# Patient Record
Sex: Male | Born: 1970 | Race: Black or African American | Hispanic: No | Smoking: Current every day smoker
Health system: Southern US, Community
[De-identification: ages and names within clinical notes are randomized; demographics above are authoritative.]

## PROBLEM LIST (undated history)

## (undated) DIAGNOSIS — F209 Schizophrenia, unspecified: Secondary | ICD-10-CM

## (undated) DIAGNOSIS — F99 Mental disorder, not otherwise specified: Secondary | ICD-10-CM

## (undated) DIAGNOSIS — I10 Essential (primary) hypertension: Secondary | ICD-10-CM

## (undated) HISTORY — PX: OTHER SURGICAL HISTORY: SHX169

---

## 2003-03-22 ENCOUNTER — Inpatient Hospital Stay (HOSPITAL_COMMUNITY): Admission: AC | Admit: 2003-03-22 | Discharge: 2003-03-24 | Payer: Self-pay

## 2006-02-11 ENCOUNTER — Emergency Department (HOSPITAL_COMMUNITY): Admission: EM | Admit: 2006-02-11 | Discharge: 2006-02-12 | Payer: Self-pay | Admitting: Emergency Medicine

## 2011-05-20 DIAGNOSIS — F200 Paranoid schizophrenia: Secondary | ICD-10-CM | POA: Diagnosis not present

## 2011-08-11 DIAGNOSIS — F200 Paranoid schizophrenia: Secondary | ICD-10-CM | POA: Diagnosis not present

## 2011-11-24 ENCOUNTER — Encounter (HOSPITAL_COMMUNITY): Payer: Self-pay | Admitting: Emergency Medicine

## 2011-11-24 ENCOUNTER — Emergency Department (HOSPITAL_COMMUNITY)
Admission: EM | Admit: 2011-11-24 | Discharge: 2011-11-25 | Disposition: A | Discharge status: Home / Self Care | Payer: Medicare Other | Attending: Emergency Medicine | Admitting: Emergency Medicine

## 2011-11-24 DIAGNOSIS — F1910 Other psychoactive substance abuse, uncomplicated: Secondary | ICD-10-CM

## 2011-11-24 DIAGNOSIS — R030 Elevated blood-pressure reading, without diagnosis of hypertension: Secondary | ICD-10-CM | POA: Insufficient documentation

## 2011-11-24 DIAGNOSIS — R443 Hallucinations, unspecified: Secondary | ICD-10-CM | POA: Insufficient documentation

## 2011-11-24 HISTORY — DX: Mental disorder, not otherwise specified: F99

## 2011-11-24 LAB — COMPREHENSIVE METABOLIC PANEL
ALT: 44 U/L (ref 0–53)
AST: 29 U/L (ref 0–37)
Albumin: 4.2 g/dL (ref 3.5–5.2)
Alkaline Phosphatase: 52 U/L (ref 39–117)
BUN: 8 mg/dL (ref 6–23)
CO2: 19 mEq/L (ref 19–32)
Calcium: 9.2 mg/dL (ref 8.4–10.5)
Chloride: 103 mEq/L (ref 96–112)
Creatinine, Ser: 1.09 mg/dL (ref 0.50–1.35)
GFR calc Af Amer: >90 mL/min (ref >90)
GFR calc non Af Amer: 83 mL/min — ABNORMAL LOW (ref >90)
Glucose, Bld: 93 mg/dL (ref 70–99)
Potassium: 4.2 mEq/L (ref 3.5–5.1)
Sodium: 137 mEq/L (ref 135–145)
Total Bilirubin: 0.3 mg/dL (ref 0.3–1.2)
Total Protein: 7.5 g/dL (ref 6.0–8.3)

## 2011-11-24 LAB — RAPID URINE DRUG SCREEN, HOSP PERFORMED
Amphetamines: NOT DETECTED
Barbiturates: NOT DETECTED
Benzodiazepines: NOT DETECTED
Cocaine: POSITIVE — AB
Opiates: NOT DETECTED
Tetrahydrocannabinol: NOT DETECTED

## 2011-11-24 LAB — CBC
HCT: 46.1 % (ref 39.0–52.0)
Hemoglobin: 16.2 g/dL (ref 13.0–17.0)
MCH: 27.2 pg (ref 26.0–34.0)
MCHC: 35.1 g/dL (ref 30.0–36.0)
MCV: 77.3 fL — ABNORMAL LOW (ref 78.0–100.0)
Platelets: 260 10*3/uL (ref 150–400)
RBC: 5.96 MIL/uL — ABNORMAL HIGH (ref 4.22–5.81)
RDW: 14.2 % (ref 11.5–15.5)
WBC: 7.2 10*3/uL (ref 4.0–10.5)

## 2011-11-24 LAB — ETHANOL: Alcohol, Ethyl (B): 31 mg/dL — ABNORMAL HIGH (ref 0–11)

## 2011-11-24 MED ORDER — ALUM & MAG HYDROXIDE-SIMETH 200-200-20 MG/5ML PO SUSP: 30.0000 mL | ORAL | Status: DC | PRN

## 2011-11-24 MED ORDER — LORAZEPAM 1 MG PO TABS
1.0000 mg | ORAL_TABLET | Freq: Three times a day (TID) | ORAL | Status: DC | PRN
  Administered 2011-11-24: 1 mg via ORAL
  Filled 2011-11-24: qty 1

## 2011-11-24 MED ORDER — ZOLPIDEM TARTRATE 5 MG PO TABS
5.0000 mg | ORAL_TABLET | Freq: Every evening | ORAL | Status: DC | PRN
  Administered 2011-11-24: 5 mg via ORAL
  Filled 2011-11-24: qty 1

## 2011-11-24 MED ORDER — ACETAMINOPHEN 325 MG PO TABS: 650.0000 mg | ORAL_TABLET | ORAL | Status: DC | PRN

## 2011-11-24 MED ORDER — IBUPROFEN 600 MG PO TABS
600.0000 mg | ORAL_TABLET | Freq: Three times a day (TID) | ORAL | Status: DC | PRN
  Administered 2011-11-25: 600 mg via ORAL
  Filled 2011-11-24: qty 3

## 2011-11-24 MED ORDER — NICOTINE 21 MG/24HR TD PT24
21.0000 mg | MEDICATED_PATCH | Freq: Every day | TRANSDERMAL | Status: DC
  Administered 2011-11-24 – 2011-11-25 (×2): 21 mg via TRANSDERMAL
  Filled 2011-11-24 (×2): qty 1

## 2011-11-24 MED ORDER — HYDROCHLOROTHIAZIDE 25 MG PO TABS
25.0000 mg | ORAL_TABLET | Freq: Every day | ORAL | Status: DC
  Administered 2011-11-24 – 2011-11-25 (×2): 25 mg via ORAL
  Filled 2011-11-24 (×3): qty 1

## 2011-11-24 MED ORDER — ONDANSETRON HCL 4 MG PO TABS: 4.0000 mg | ORAL_TABLET | Freq: Three times a day (TID) | ORAL | Status: DC | PRN

## 2011-11-24 NOTE — ED Provider Notes (Addendum)
History     CSN: [id number]  Arrival date & time 11/24/11  1538   First MD Initiated Contact with Patient 11/24/11 1608      Chief Complaint  Patient presents with  . Medical Clearance    (Consider location/radiation/quality/duration/timing/severity/associated sxs/prior treatment) HPI Patient is a 41 year old male who has history of polysubstance abuse as well as schizophrenia. Patient reported to nursing staff that he has been hearing voices that were telling him to commit suicide. During our discussion patient actively denied suicidal or homicidal ideation. He did endorse hallucinations telling him to use drugs. He uses cocaine as well as beer and reports that his last use was yesterday. Patient is only followed up at [facility]. He was last hospitalized at [facility] for the same. Patient is on monthly injection of Haldol and denies missing this. There are no other associated or modifying factors. History reviewed. No pertinent past medical history.  History reviewed. No pertinent past surgical history.  History reviewed. No pertinent family history.  History  Substance Use Topics  . Smoking status: Not on file  . Smokeless tobacco: Not on file  . Alcohol Use: Not on file      Review of Systems  Constitutional: Negative.   HENT: Negative.   Eyes: Negative.   Respiratory: Negative.   Cardiovascular: Negative.   Gastrointestinal: Negative.   Genitourinary: Negative.   Musculoskeletal: Negative.   Neurological: Negative.   Hematological: Negative.   Psychiatric/Behavioral: Positive for hallucinations. Negative for suicidal ideas.       Substance abuse including cocaine and alcohol  All other systems reviewed and are negative.    Allergies  Review of patient's allergies indicates no known allergies.  Home Medications   Current Outpatient Rx  Name Route Sig Dispense Refill  . ASPIRIN-ACETAMINOPHEN-CAFFEINE 500-325-65 MG PO PACK Oral Take 1 packet by mouth  every 6 (six) hours as needed.    Marland Kitchen HALOPERIDOL DECANOATE 50 MG/ML IM SOLN Intramuscular Inject into the muscle every 28 (twenty-eight) days.       BP 157/102  Pulse 68  Temp 98.7 F (37.1 C)  SpO2 98%  Physical Exam  Nursing note and vitals reviewed. GEN: Well-developed, well-nourished male in no distress HEENT: Atraumatic, normocephalic. Oropharynx clear without erythema EYES: PERRLA BL, no scleral icterus. NECK: Trachea midline, no meningismus CV: regular rate and rhythm. No murmurs, rubs, or gallops PULM: No respiratory distress.  No crackles, wheezes, or rales. GI: soft, non-tender. No guarding, rebound, or tenderness. + bowel sounds  GU: deferred Neuro: cranial nerves grossly 2-12 intact, no abnormalities of strength or sensation, A and O x 3 MSK: Patient moves all 4 extremities symmetrically, no deformity, edema, or injury noted Skin: No rashes petechiae, purpura, or jaundice Psych: Denies suicidal or homicidal ideation though he endorsed suicidal ideation to nursing staff. Patient endorses auditory hallucinations telling him to use drugs. He endorses cocaine and alcohol use.  ED Course  Procedures (including critical care time)  Labs Reviewed  CBC - Abnormal; Notable for the following:    RBC 5.96 (*)     MCV 77.3 (*)     All other components within normal limits  COMPREHENSIVE METABOLIC PANEL - Abnormal; Notable for the following:    GFR calc non Af Amer 83 (*)     All other components within normal limits  ETHANOL - Abnormal; Notable for the following:    Alcohol, Ethyl (B) 31 (*)     All other components within normal limits  URINE RAPID  DRUG SCREEN (HOSP PERFORMED)   No results found.   1. Hallucinations   2. Polysubstance abuse       MDM  She was evaluated by myself. Based on evaluation patient had workup for likely admission for psychosis and substance abuse. Patient had no other complaints and was not actively suicidal or homicidal on my assessment.  Labs were unremarkable. Initial blood pressure has been recorded as 187/126. On repeat this was 157/102. Patient does not take any regular hypertension meds. Holding orders were placed. Patient had normal renal function and no allergies. He was started on HCTZ for his blood pressure. Act was consulted for placement.          Cyndra Numbers, MD 11/24/11 1610  Cyndra Numbers, MD 11/25/11 0010

## 2011-11-24 NOTE — ED Notes (Signed)
Pt is a pt at [facility]. Pt states the voices in his head are telling him to do drugs. Pt states sometimes the voices are telling him to commit suicide. Pt states these symptoms have been going on for a couple of months and today he "got fed up with it" and decidid he needed help. Pt BIB his mother. Pt calm, cooperative.

## 2011-11-24 NOTE — ED Notes (Signed)
Pt's mother took one pt belongings bag home.

## 2011-11-24 NOTE — ED Notes (Signed)
Pt. Reports he is here to stop taking drugs.  He denies S.I. Or H.I.  Pt. Reports he has history of schizophrenia.  Reports voices tell him to take cocaine and sometimes to kill himself.

## 2011-11-25 ENCOUNTER — Encounter (HOSPITAL_COMMUNITY): Payer: Self-pay | Admitting: *Deleted

## 2011-11-25 DIAGNOSIS — R443 Hallucinations, unspecified: Secondary | ICD-10-CM

## 2011-11-25 DIAGNOSIS — F1910 Other psychoactive substance abuse, uncomplicated: Secondary | ICD-10-CM

## 2011-11-25 NOTE — ED Notes (Signed)
Call out to ED Doctor due to elevated B/P noted at 154/110. Advised to Continue to document and monitor B/P.  Pt with no signs or symptoms of distress or discomfort at this time. Pt due for HCTZ this am. Will follow up with dayshift nurse regarding elevated B/P. B/P at this time Lower than admitting B/P, see Doc vitals.

## 2011-11-25 NOTE — ED Notes (Signed)
Pt. Denies S/H/I.  Pt. Reports voices telling him to take cocaine. Last used cocaine x2 days ago.  Pt. Pleasant, cooperative.

## 2011-11-25 NOTE — ED Notes (Signed)
Pt having breakfast.  Pt denies hearing voices, denies SI and HI.  Sitter at bedside.

## 2011-11-25 NOTE — ED Notes (Signed)
Report given to [facility] and pt transferred to Rm 36.  Pt had no belongings to transfer

## 2011-11-25 NOTE — BH Assessment (Signed)
Assessment Note   Joseph Gallegos is an 41 y.o. male. Pt presents to [facility] per his report his Blood Pressure was "high" and his mother wanted him to get help with his Cocaine/Crack use. Pt reports yesterday that he heard voices while using cocaine,pt reports that the voices shouted"Cocaine Wes". Pt reports that he only hears voices while using cocaine and denies hx of hearing voices when he is not using cocaine. Pt denies SI,HI, and denies any current psychotic symptoms and reports that he has not heard any voices today. Pt reports that he is fine at this point and is not interested in Substance Abuse Treament at this time. Outpatient SA referrals provided and pt recommended to utilize referrals as needed. Pt also encouraged to continue psychiatric follow up with [facility]. Dr.Jonnalagadda consulted with pt and recommended that pt be discharged to follow up with current provider.   Axis I: Cocaine Abuse Axis II: Deferred Axis III:  Past Medical History  Diagnosis Date  . Mental disorder    Axis IV: other psychosocial or environmental problems, problems related to social environment and problems with primary support group Axis V: 51-60 moderate symptoms  Past Medical History:  Past Medical History  Diagnosis Date  . Mental disorder     History reviewed. No pertinent past surgical history.  Family History: History reviewed. No pertinent family history.  Social History:  does not have a smoking history on file. He does not have any smokeless tobacco history on file. He reports that he uses illicit drugs (Cocaine). His alcohol history not on file.  Additional Social History:  Alcohol / Drug Use Prescriptions:  (yes) Over the Counter:  (none reported) History of alcohol / drug use?: Yes Substance #1 Name of Substance 1:  (Crack/Cocaine) 1 - Age of First Use:  (24 or 25) 1 - Amount (size/oz):  (1-2 crack rocks) 1 - Frequency:  (Daily) 1 - Duration:  (ongoing use for  yrs) 1 - Last Use  / Amount:  (unknown)  CIWA: CIWA-Ar BP: 146/104 mmHg Pulse Rate: 65  COWS:    Allergies: No Known Allergies  Home Medications:  (Not in a hospital admission)  OB/GYN Status:  No LMP for male patient.  General Assessment Data Location of Assessment: [facility] ED ACT Assessment: Yes Living Arrangements: Alone Can pt return to current living arrangement?: Yes Admission Status: Voluntary Is patient capable of signing voluntary admission?: Yes Transfer from: Home Referral Source: Self/Family/Friend  Education Status Is patient currently in school?: No  Risk to self Suicidal Ideation: No Suicidal Intent: No Is patient at risk for suicide?: No Suicidal Plan?: No Access to Means: No What has been your use of drugs/alcohol within the last 12 months?: crack/cocaine Previous Attempts/Gestures: No Other Self Harm Risks: none Triggers for Past Attempts: None known Intentional Self Injurious Behavior: None Family Suicide History: No Recent stressful life event(s): Conflict (Comment) (conflict w/mom who wants him to get help) Persecutory voices/beliefs?: No Depression: No Substance abuse history and/or treatment for substance abuse?: Yes Suicide prevention information given to non-admitted patients: Yes  Risk to Others Homicidal Ideation: No Thoughts of Harm to Others: No Current Homicidal Intent: No Current Homicidal Plan: No Access to Homicidal Means: No Identified Victim: na History of harm to others?: No Assessment of Violence: None Noted Violent Behavior Description: Cooperative Does patient have access to weapons?: No Criminal Charges Pending?: No Does patient have a court date: No  Psychosis Hallucinations: Auditory ( yesterday voices "shouted  cocaine Wes") Delusions: None  noted  Mental Status Report Appear/Hygiene: Other (Comment) (Appropriate) Eye Contact: Fair Motor Activity: Other (Comment) Speech: Logical/coherent Level of Consciousness: Alert Mood: Other  (Comment) (Appropriate) Affect: Appropriate to circumstance Anxiety Level: Minimal Thought Processes: Coherent;Relevant Judgement: Unimpaired Orientation: Person;Place;Time;Situation Obsessive Compulsive Thoughts/Behaviors: None  Cognitive Functioning Concentration: Normal Memory: Recent Intact;Remote Intact IQ: Average Insight: Fair Impulse Control: Poor Appetite: Good Sleep: No Change Vegetative Symptoms: None  ADLScreening [facility] Assessment Services) Patient's cognitive ability adequate to safely complete daily activities?: Yes Patient able to express need for assistance with ADLs?: Yes Independently performs ADLs?: Yes  Abuse/Neglect [facility]) Physical Abuse: Denies Verbal Abuse: Denies Sexual Abuse: Denies  Prior Inpatient Therapy Prior Inpatient Therapy: No  Prior Outpatient Therapy Prior Outpatient Therapy: Yes Prior Therapy Dates: Current  Prior Therapy Facilty/Provider(s): [facility] Reason for Treatment: Meds  ADL Screening (condition at time of admission) Patient's cognitive ability adequate to safely complete daily activities?: Yes Patient able to express need for assistance with ADLs?: Yes Independently performs ADLs?: Yes Weakness of Legs: None Weakness of Arms/Hands: None  Home Assistive Devices/Equipment Home Assistive Devices/Equipment: None    Abuse/Neglect Assessment (Assessment to be complete while patient is alone) Physical Abuse: Denies Verbal Abuse: Denies Sexual Abuse: Denies Exploitation of patient/patient's resources: Denies Self-Neglect: Denies Values / Beliefs Cultural Requests During Hospitalization: None Spiritual Requests During Hospitalization: None   Advance Directives (For Healthcare) Advance Directive: Patient does not have advance directive;Patient would not like information Nutrition Screen Unintentional weight loss greater than 10lbs within the last month: No Problems chewing or swallowing foods and/or liquids: No Home Tube  Feeding or Total Parenteral Nutrition (TPN): No Patient appears severely malnourished: No  Additional Information 1:1 In Past 12 Months?: No CIRT Risk: No Elopement Risk: No Does patient have medical clearance?: No     Disposition:  Disposition Disposition of Patient: Outpatient treatment (Pt referred to F/U with current provider,SA referrals provid) Type of outpatient treatment: Adult  On Site Evaluation by:   Reviewed with Physician:     Bjorn Pippin 11/25/2011 5:52 PM

## 2011-11-25 NOTE — Consult Note (Signed)
Reason for Consult: Cocaine-induced psychosis Referring Physician: Dr. Blima Singer Joseph Gallegos is an 41 y.o. male.  HPI: Patient was seen and chart reviewed. Patient has a chronic history of for schizophrenia and has been receiving outpatient psychiatric services from a [facility]. Patient has been placed on depot medication of Haldol, last taken shot was November 02, 2011. Patient was brought in by his mother because he is reporting having the hearing voices telling him to do more drugs. Patient has been feeling better today and stated he has no hallucinations. He does not hear voices any more. He. He has denied symptoms of depression, anxiety, suicidal ideation, homicidal ideation, intentions and plans.  History reviewed. No pertinent past medical history.  History reviewed. No pertinent past surgical history.  History reviewed. No pertinent family history.  Social History:  does not have a smoking history on file. He does not have any smokeless tobacco history on file. His alcohol and drug histories not on file.  Allergies: No Known Allergies  Medications: I have reviewed the patient's current medications.  Results for orders placed during the hospital encounter of 11/24/11 (from the past 48 hour(s))  CBC     Status: Abnormal   Collection Time   11/24/11  4:10 PM      Component Value Range Comment   WBC 7.2  4.0 - 10.5 K/uL    RBC 5.96 (*) 4.22 - 5.81 MIL/uL    Hemoglobin 16.2  13.0 - 17.0 g/dL    HCT 21.3  08.6 - 57.8 %    MCV 77.3 (*) 78.0 - 100.0 fL    MCH 27.2  26.0 - 34.0 pg    MCHC 35.1  30.0 - 36.0 g/dL    RDW 46.9  62.9 - 52.8 %    Platelets 260  150 - 400 K/uL   COMPREHENSIVE METABOLIC PANEL     Status: Abnormal   Collection Time   11/24/11  4:10 PM      Component Value Range Comment   Sodium 137  135 - 145 mEq/L    Potassium 4.2  3.5 - 5.1 mEq/L    Chloride 103  96 - 112 mEq/L    CO2 19  19 - 32 mEq/L    Glucose, Bld 93  70 - 99 mg/dL    BUN 8  6  - 23 mg/dL    Creatinine, Ser 4.13  0.50 - 1.35 mg/dL    Calcium 9.2  8.4 - 24.4 mg/dL    Total Protein 7.5  6.0 - 8.3 g/dL    Albumin 4.2  3.5 - 5.2 g/dL    AST 29  0 - 37 U/L    ALT 44  0 - 53 U/L    Alkaline Phosphatase 52  39 - 117 U/L    Total Bilirubin 0.3  0.3 - 1.2 mg/dL    GFR calc non Af Amer 83 (*) >90 mL/min    GFR calc Af Amer >90  >90 mL/min   ETHANOL     Status: Abnormal   Collection Time   11/24/11  4:10 PM      Component Value Range Comment   Alcohol, Ethyl (B) 31 (*) 0 - 11 mg/dL   URINE RAPID DRUG SCREEN (HOSP PERFORMED)     Status: Abnormal   Collection Time   11/24/11  4:24 PM      Component Value Range Comment   Opiates NONE DETECTED  NONE DETECTED    Cocaine POSITIVE (*)  NONE DETECTED    Benzodiazepines NONE DETECTED  NONE DETECTED    Amphetamines NONE DETECTED  NONE DETECTED    Tetrahydrocannabinol NONE DETECTED  NONE DETECTED    Barbiturates NONE DETECTED  NONE DETECTED     No results found.  Positive for illegal drug usage Blood pressure 154/110, pulse 73, temperature 97.9 F (36.6 C), temperature source Oral, resp. rate 18, SpO2 98.00%.   Assessment/Plan: Schizophrenia, undifferentiated Cocaine abuse  Substance induced psychosis  Recommended discharge patient to the home with a plan of outpatient psychiatric services. Patient does not meet criteria for acute psychiatric hospitalization.  Ravindra Baranek,JANARDHAHA R. 11/25/2011, 12:09 PM

## 2011-11-25 NOTE — ED Notes (Signed)
Pt. Awaiting ACT team information before D/C

## 2012-11-09 DIAGNOSIS — F200 Paranoid schizophrenia: Secondary | ICD-10-CM | POA: Diagnosis not present

## 2012-12-07 DIAGNOSIS — F200 Paranoid schizophrenia: Secondary | ICD-10-CM | POA: Diagnosis not present

## 2013-01-04 DIAGNOSIS — F200 Paranoid schizophrenia: Secondary | ICD-10-CM | POA: Diagnosis not present

## 2013-01-22 DIAGNOSIS — F200 Paranoid schizophrenia: Secondary | ICD-10-CM | POA: Diagnosis not present

## 2013-02-01 DIAGNOSIS — F200 Paranoid schizophrenia: Secondary | ICD-10-CM | POA: Diagnosis not present

## 2013-03-01 DIAGNOSIS — F200 Paranoid schizophrenia: Secondary | ICD-10-CM | POA: Diagnosis not present

## 2013-04-05 DIAGNOSIS — F200 Paranoid schizophrenia: Secondary | ICD-10-CM | POA: Diagnosis not present

## 2013-04-09 DIAGNOSIS — F209 Schizophrenia, unspecified: Secondary | ICD-10-CM | POA: Diagnosis not present

## 2013-05-03 DIAGNOSIS — F200 Paranoid schizophrenia: Secondary | ICD-10-CM | POA: Diagnosis not present

## 2013-06-07 DIAGNOSIS — F209 Schizophrenia, unspecified: Secondary | ICD-10-CM | POA: Diagnosis not present

## 2013-06-07 DIAGNOSIS — F200 Paranoid schizophrenia: Secondary | ICD-10-CM | POA: Diagnosis not present

## 2013-07-05 DIAGNOSIS — F209 Schizophrenia, unspecified: Secondary | ICD-10-CM | POA: Diagnosis not present

## 2013-08-02 DIAGNOSIS — F209 Schizophrenia, unspecified: Secondary | ICD-10-CM | POA: Diagnosis not present

## 2013-08-30 DIAGNOSIS — F209 Schizophrenia, unspecified: Secondary | ICD-10-CM | POA: Diagnosis not present

## 2013-09-27 DIAGNOSIS — F209 Schizophrenia, unspecified: Secondary | ICD-10-CM | POA: Diagnosis not present

## 2013-10-25 DIAGNOSIS — F209 Schizophrenia, unspecified: Secondary | ICD-10-CM | POA: Diagnosis not present

## 2013-10-28 DIAGNOSIS — F209 Schizophrenia, unspecified: Secondary | ICD-10-CM | POA: Diagnosis not present

## 2013-11-22 DIAGNOSIS — F209 Schizophrenia, unspecified: Secondary | ICD-10-CM | POA: Diagnosis not present

## 2013-12-20 DIAGNOSIS — F209 Schizophrenia, unspecified: Secondary | ICD-10-CM | POA: Diagnosis not present

## 2014-01-17 DIAGNOSIS — F209 Schizophrenia, unspecified: Secondary | ICD-10-CM | POA: Diagnosis not present

## 2014-02-14 DIAGNOSIS — F209 Schizophrenia, unspecified: Secondary | ICD-10-CM | POA: Diagnosis not present

## 2014-05-09 DIAGNOSIS — F209 Schizophrenia, unspecified: Secondary | ICD-10-CM | POA: Diagnosis not present

## 2014-05-29 DIAGNOSIS — F209 Schizophrenia, unspecified: Secondary | ICD-10-CM | POA: Diagnosis not present

## 2014-05-29 DIAGNOSIS — I10 Essential (primary) hypertension: Secondary | ICD-10-CM | POA: Diagnosis not present

## 2014-05-29 DIAGNOSIS — F17290 Nicotine dependence, other tobacco product, uncomplicated: Secondary | ICD-10-CM | POA: Diagnosis not present

## 2014-06-04 DIAGNOSIS — D235 Other benign neoplasm of skin of trunk: Secondary | ICD-10-CM | POA: Diagnosis not present

## 2014-06-04 DIAGNOSIS — D171 Benign lipomatous neoplasm of skin and subcutaneous tissue of trunk: Secondary | ICD-10-CM | POA: Diagnosis not present

## 2014-06-04 DIAGNOSIS — L989 Disorder of the skin and subcutaneous tissue, unspecified: Secondary | ICD-10-CM | POA: Diagnosis not present

## 2014-06-11 DIAGNOSIS — F209 Schizophrenia, unspecified: Secondary | ICD-10-CM | POA: Diagnosis not present

## 2014-06-11 DIAGNOSIS — I10 Essential (primary) hypertension: Secondary | ICD-10-CM | POA: Diagnosis not present

## 2014-06-11 DIAGNOSIS — E785 Hyperlipidemia, unspecified: Secondary | ICD-10-CM | POA: Diagnosis not present

## 2014-06-12 DIAGNOSIS — D179 Benign lipomatous neoplasm, unspecified: Secondary | ICD-10-CM | POA: Diagnosis not present

## 2014-06-20 ENCOUNTER — Other Ambulatory Visit (HOSPITAL_COMMUNITY)
Admission: RE | Admit: 2014-06-20 | Discharge: 2014-06-20 | Disposition: A | Discharge status: Home / Self Care | Payer: Medicare Other | Source: Ambulatory Visit | Attending: Internal Medicine | Admitting: Internal Medicine

## 2014-06-20 DIAGNOSIS — F209 Schizophrenia, unspecified: Secondary | ICD-10-CM | POA: Diagnosis not present

## 2014-06-20 DIAGNOSIS — I10 Essential (primary) hypertension: Secondary | ICD-10-CM | POA: Diagnosis not present

## 2014-06-21 DIAGNOSIS — Z114 Encounter for screening for human immunodeficiency virus [HIV]: Secondary | ICD-10-CM | POA: Diagnosis not present

## 2014-07-10 DIAGNOSIS — D2372 Other benign neoplasm of skin of left lower limb, including hip: Secondary | ICD-10-CM | POA: Diagnosis not present

## 2014-07-10 DIAGNOSIS — M79671 Pain in right foot: Secondary | ICD-10-CM | POA: Diagnosis not present

## 2014-07-10 DIAGNOSIS — M79672 Pain in left foot: Secondary | ICD-10-CM | POA: Diagnosis not present

## 2014-07-10 DIAGNOSIS — D2371 Other benign neoplasm of skin of right lower limb, including hip: Secondary | ICD-10-CM | POA: Diagnosis not present

## 2014-07-10 DIAGNOSIS — B351 Tinea unguium: Secondary | ICD-10-CM | POA: Diagnosis not present

## 2014-07-15 DIAGNOSIS — M79601 Pain in right arm: Secondary | ICD-10-CM | POA: Diagnosis not present

## 2014-07-15 DIAGNOSIS — I10 Essential (primary) hypertension: Secondary | ICD-10-CM | POA: Diagnosis not present

## 2014-08-01 DIAGNOSIS — F209 Schizophrenia, unspecified: Secondary | ICD-10-CM | POA: Diagnosis not present

## 2014-09-10 DIAGNOSIS — F209 Schizophrenia, unspecified: Secondary | ICD-10-CM | POA: Diagnosis not present

## 2014-09-10 DIAGNOSIS — F17200 Nicotine dependence, unspecified, uncomplicated: Secondary | ICD-10-CM | POA: Diagnosis not present

## 2014-09-10 DIAGNOSIS — I10 Essential (primary) hypertension: Secondary | ICD-10-CM | POA: Diagnosis not present

## 2014-10-03 DIAGNOSIS — F17290 Nicotine dependence, other tobacco product, uncomplicated: Secondary | ICD-10-CM | POA: Diagnosis not present

## 2014-10-03 DIAGNOSIS — I10 Essential (primary) hypertension: Secondary | ICD-10-CM | POA: Diagnosis not present

## 2014-10-03 DIAGNOSIS — F209 Schizophrenia, unspecified: Secondary | ICD-10-CM | POA: Diagnosis not present

## 2014-11-27 DIAGNOSIS — F209 Schizophrenia, unspecified: Secondary | ICD-10-CM | POA: Diagnosis not present

## 2014-12-22 DIAGNOSIS — I10 Essential (primary) hypertension: Secondary | ICD-10-CM | POA: Diagnosis not present

## 2014-12-22 DIAGNOSIS — F17290 Nicotine dependence, other tobacco product, uncomplicated: Secondary | ICD-10-CM | POA: Diagnosis not present

## 2015-01-15 DIAGNOSIS — F209 Schizophrenia, unspecified: Secondary | ICD-10-CM | POA: Diagnosis not present

## 2015-01-19 DIAGNOSIS — F209 Schizophrenia, unspecified: Secondary | ICD-10-CM | POA: Diagnosis not present

## 2015-01-19 DIAGNOSIS — F17290 Nicotine dependence, other tobacco product, uncomplicated: Secondary | ICD-10-CM | POA: Diagnosis not present

## 2015-01-19 DIAGNOSIS — I10 Essential (primary) hypertension: Secondary | ICD-10-CM | POA: Diagnosis not present

## 2015-04-13 DIAGNOSIS — F17290 Nicotine dependence, other tobacco product, uncomplicated: Secondary | ICD-10-CM | POA: Diagnosis not present

## 2015-04-13 DIAGNOSIS — I10 Essential (primary) hypertension: Secondary | ICD-10-CM | POA: Diagnosis not present

## 2015-04-13 DIAGNOSIS — F17200 Nicotine dependence, unspecified, uncomplicated: Secondary | ICD-10-CM | POA: Diagnosis not present

## 2015-04-13 DIAGNOSIS — F209 Schizophrenia, unspecified: Secondary | ICD-10-CM | POA: Diagnosis not present

## 2015-04-15 DIAGNOSIS — I10 Essential (primary) hypertension: Secondary | ICD-10-CM | POA: Diagnosis not present

## 2015-04-22 DIAGNOSIS — I10 Essential (primary) hypertension: Secondary | ICD-10-CM | POA: Diagnosis not present

## 2015-04-22 DIAGNOSIS — F209 Schizophrenia, unspecified: Secondary | ICD-10-CM | POA: Diagnosis not present

## 2015-05-13 DIAGNOSIS — F209 Schizophrenia, unspecified: Secondary | ICD-10-CM | POA: Diagnosis not present

## 2015-05-26 DIAGNOSIS — F17290 Nicotine dependence, other tobacco product, uncomplicated: Secondary | ICD-10-CM | POA: Diagnosis not present

## 2015-05-26 DIAGNOSIS — F209 Schizophrenia, unspecified: Secondary | ICD-10-CM | POA: Diagnosis not present

## 2015-05-26 DIAGNOSIS — I10 Essential (primary) hypertension: Secondary | ICD-10-CM | POA: Diagnosis not present

## 2015-08-27 DIAGNOSIS — F209 Schizophrenia, unspecified: Secondary | ICD-10-CM | POA: Diagnosis not present

## 2016-03-03 DIAGNOSIS — F209 Schizophrenia, unspecified: Secondary | ICD-10-CM | POA: Diagnosis not present

## 2016-04-28 DIAGNOSIS — Z23 Encounter for immunization: Secondary | ICD-10-CM | POA: Diagnosis not present

## 2016-04-28 DIAGNOSIS — I10 Essential (primary) hypertension: Secondary | ICD-10-CM | POA: Diagnosis not present

## 2016-04-28 DIAGNOSIS — F209 Schizophrenia, unspecified: Secondary | ICD-10-CM | POA: Diagnosis not present

## 2016-04-28 DIAGNOSIS — Z0000 Encounter for general adult medical examination without abnormal findings: Secondary | ICD-10-CM | POA: Diagnosis not present

## 2016-04-28 DIAGNOSIS — J449 Chronic obstructive pulmonary disease, unspecified: Secondary | ICD-10-CM | POA: Diagnosis not present

## 2016-06-23 DIAGNOSIS — F209 Schizophrenia, unspecified: Secondary | ICD-10-CM | POA: Diagnosis not present

## 2016-10-13 DIAGNOSIS — F209 Schizophrenia, unspecified: Secondary | ICD-10-CM | POA: Diagnosis not present

## 2017-02-16 DIAGNOSIS — F209 Schizophrenia, unspecified: Secondary | ICD-10-CM | POA: Diagnosis not present

## 2017-03-02 DIAGNOSIS — L209 Atopic dermatitis, unspecified: Secondary | ICD-10-CM | POA: Diagnosis not present

## 2017-03-02 DIAGNOSIS — J449 Chronic obstructive pulmonary disease, unspecified: Secondary | ICD-10-CM | POA: Diagnosis not present

## 2017-03-02 DIAGNOSIS — I10 Essential (primary) hypertension: Secondary | ICD-10-CM | POA: Diagnosis not present

## 2017-07-20 DIAGNOSIS — F209 Schizophrenia, unspecified: Secondary | ICD-10-CM | POA: Diagnosis not present

## 2017-08-10 DIAGNOSIS — J449 Chronic obstructive pulmonary disease, unspecified: Secondary | ICD-10-CM | POA: Diagnosis not present

## 2017-08-10 DIAGNOSIS — R739 Hyperglycemia, unspecified: Secondary | ICD-10-CM | POA: Diagnosis not present

## 2017-08-10 DIAGNOSIS — F209 Schizophrenia, unspecified: Secondary | ICD-10-CM | POA: Diagnosis not present

## 2017-08-10 DIAGNOSIS — Z1389 Encounter for screening for other disorder: Secondary | ICD-10-CM | POA: Diagnosis not present

## 2017-08-10 DIAGNOSIS — Z1331 Encounter for screening for depression: Secondary | ICD-10-CM | POA: Diagnosis not present

## 2017-08-10 DIAGNOSIS — I10 Essential (primary) hypertension: Secondary | ICD-10-CM | POA: Diagnosis not present

## 2017-08-10 DIAGNOSIS — Z0000 Encounter for general adult medical examination without abnormal findings: Secondary | ICD-10-CM | POA: Diagnosis not present

## 2017-08-10 DIAGNOSIS — F17290 Nicotine dependence, other tobacco product, uncomplicated: Secondary | ICD-10-CM | POA: Diagnosis not present

## 2017-11-09 DIAGNOSIS — F209 Schizophrenia, unspecified: Secondary | ICD-10-CM | POA: Diagnosis not present

## 2017-11-14 DIAGNOSIS — F209 Schizophrenia, unspecified: Secondary | ICD-10-CM | POA: Diagnosis not present

## 2017-11-14 DIAGNOSIS — I10 Essential (primary) hypertension: Secondary | ICD-10-CM | POA: Diagnosis not present

## 2017-11-14 DIAGNOSIS — L259 Unspecified contact dermatitis, unspecified cause: Secondary | ICD-10-CM | POA: Diagnosis not present

## 2018-02-15 DIAGNOSIS — F209 Schizophrenia, unspecified: Secondary | ICD-10-CM | POA: Diagnosis not present

## 2018-04-26 DIAGNOSIS — F209 Schizophrenia, unspecified: Secondary | ICD-10-CM | POA: Diagnosis not present

## 2018-06-01 DIAGNOSIS — F209 Schizophrenia, unspecified: Secondary | ICD-10-CM | POA: Diagnosis not present

## 2018-06-06 DIAGNOSIS — F209 Schizophrenia, unspecified: Secondary | ICD-10-CM | POA: Diagnosis not present

## 2018-07-26 DIAGNOSIS — F209 Schizophrenia, unspecified: Secondary | ICD-10-CM | POA: Diagnosis not present

## 2018-08-23 DIAGNOSIS — F209 Schizophrenia, unspecified: Secondary | ICD-10-CM | POA: Diagnosis not present

## 2018-09-20 DIAGNOSIS — F209 Schizophrenia, unspecified: Secondary | ICD-10-CM | POA: Diagnosis not present

## 2018-10-13 ENCOUNTER — Encounter (HOSPITAL_COMMUNITY): Payer: Self-pay | Admitting: Emergency Medicine

## 2018-10-13 ENCOUNTER — Other Ambulatory Visit: Payer: Self-pay

## 2018-10-13 ENCOUNTER — Emergency Department (HOSPITAL_COMMUNITY): Payer: Medicare Other

## 2018-10-13 ENCOUNTER — Emergency Department (HOSPITAL_COMMUNITY)
Admission: EM | Admit: 2018-10-13 | Discharge: 2018-10-13 | Disposition: A | Discharge status: Home / Self Care | Payer: Medicare Other | Attending: Emergency Medicine | Admitting: Emergency Medicine

## 2018-10-13 DIAGNOSIS — Z79899 Other long term (current) drug therapy: Secondary | ICD-10-CM | POA: Insufficient documentation

## 2018-10-13 DIAGNOSIS — Y999 Unspecified external cause status: Secondary | ICD-10-CM | POA: Insufficient documentation

## 2018-10-13 DIAGNOSIS — S62316A Displaced fracture of base of fifth metacarpal bone, right hand, initial encounter for closed fracture: Secondary | ICD-10-CM | POA: Diagnosis not present

## 2018-10-13 DIAGNOSIS — F17210 Nicotine dependence, cigarettes, uncomplicated: Secondary | ICD-10-CM | POA: Insufficient documentation

## 2018-10-13 DIAGNOSIS — Y939 Activity, unspecified: Secondary | ICD-10-CM | POA: Insufficient documentation

## 2018-10-13 DIAGNOSIS — S62324A Displaced fracture of shaft of fourth metacarpal bone, right hand, initial encounter for closed fracture: Secondary | ICD-10-CM | POA: Diagnosis not present

## 2018-10-13 DIAGNOSIS — Y92513 Shop (commercial) as the place of occurrence of the external cause: Secondary | ICD-10-CM | POA: Insufficient documentation

## 2018-10-13 DIAGNOSIS — S6991XA Unspecified injury of right wrist, hand and finger(s), initial encounter: Secondary | ICD-10-CM | POA: Diagnosis present

## 2018-10-13 DIAGNOSIS — S62325A Displaced fracture of shaft of fourth metacarpal bone, left hand, initial encounter for closed fracture: Secondary | ICD-10-CM | POA: Diagnosis not present

## 2018-10-13 MED ORDER — KETOROLAC TROMETHAMINE 60 MG/2ML IM SOLN
60.0000 mg | Freq: Once | INTRAMUSCULAR | Status: AC
  Administered 2018-10-13: 60 mg via INTRAMUSCULAR
  Filled 2018-10-13: qty 2

## 2018-10-13 MED ORDER — AMOXICILLIN-POT CLAVULANATE 875-125 MG PO TABS: 1.0000 | ORAL_TABLET | Freq: Two times a day (BID) | ORAL | 0 refills | Status: DC

## 2018-10-13 MED ORDER — NAPROXEN 500 MG PO TABS: 500.0000 mg | ORAL_TABLET | Freq: Two times a day (BID) | ORAL | 0 refills | Status: DC

## 2018-10-13 MED ORDER — BACITRACIN ZINC 500 UNIT/GM EX OINT
1.0000 "application " | TOPICAL_OINTMENT | Freq: Two times a day (BID) | CUTANEOUS | Status: DC
  Administered 2018-10-13: 1 via TOPICAL
  Filled 2018-10-13: qty 0.9

## 2018-10-13 MED ORDER — HYDROCODONE-ACETAMINOPHEN 5-325 MG PO TABS: 1.0000 | ORAL_TABLET | Freq: Four times a day (QID) | ORAL | 0 refills | Status: DC | PRN

## 2018-10-13 NOTE — Discharge Instructions (Signed)
Your hand has 2 fractures.  You will need to have a orthopedic consultation to help fix this fracture.  I have discussed your care with Dr. Aline Brochure who will be happy to see you within the next week.  Please call the office on Monday morning to make the appointment.  You may take the following medications as needed  Naproxen, twice a day Hydrocodone, 1 to 2 tablets every 6 hours only for severe pain, please be aware that this medication can be addicting and sedating.
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
## 2018-10-18 DIAGNOSIS — F209 Schizophrenia, unspecified: Secondary | ICD-10-CM | POA: Diagnosis not present

## 2018-10-24 ENCOUNTER — Other Ambulatory Visit: Payer: Self-pay

## 2018-10-24 ENCOUNTER — Ambulatory Visit (INDEPENDENT_AMBULATORY_CARE_PROVIDER_SITE_OTHER): Payer: Medicare Other | Admitting: Orthopedic Surgery

## 2018-10-24 ENCOUNTER — Encounter: Payer: Self-pay | Admitting: Orthopedic Surgery

## 2018-10-24 VITALS — BP 139/91 | HR 76 | Temp 97.1°F | Ht 71.0 in | Wt 378.0 lb

## 2018-10-24 DIAGNOSIS — S62324A Displaced fracture of shaft of fourth metacarpal bone, right hand, initial encounter for closed fracture: Secondary | ICD-10-CM | POA: Diagnosis not present

## 2018-10-24 DIAGNOSIS — S62316A Displaced fracture of base of fifth metacarpal bone, right hand, initial encounter for closed fracture: Secondary | ICD-10-CM
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
## 2018-10-29 ENCOUNTER — Telehealth: Payer: Self-pay

## 2018-10-29 DIAGNOSIS — S62324A Displaced fracture of shaft of fourth metacarpal bone, right hand, initial encounter for closed fracture: Secondary | ICD-10-CM | POA: Diagnosis not present

## 2018-10-29 DIAGNOSIS — S62316A Displaced fracture of base of fifth metacarpal bone, right hand, initial encounter for closed fracture: Secondary | ICD-10-CM | POA: Diagnosis not present

## 2018-10-29 NOTE — Telephone Encounter (Signed)
Beverly at hand center had information was to call with appt, I called him to give the number so he can call her. I asked him to call her today he voiced understanding.

## 2018-10-29 NOTE — Telephone Encounter (Signed)
Patient called to check on his referral to a hand surgeon. I told him that I would send you a message. I did see where the referral was closed, but no reason why.  You can reach him at [phone]

## 2018-10-30 ENCOUNTER — Other Ambulatory Visit: Payer: Self-pay | Admitting: Orthopedic Surgery

## 2018-10-30 ENCOUNTER — Other Ambulatory Visit (HOSPITAL_COMMUNITY)
Admission: RE | Admit: 2018-10-30 | Discharge: 2018-10-30 | Disposition: A | Discharge status: Home / Self Care | Payer: Medicare Other | Source: Ambulatory Visit | Attending: Orthopedic Surgery | Admitting: Orthopedic Surgery

## 2018-10-30 ENCOUNTER — Encounter (HOSPITAL_BASED_OUTPATIENT_CLINIC_OR_DEPARTMENT_OTHER): Payer: Self-pay | Admitting: *Deleted

## 2018-10-30 ENCOUNTER — Other Ambulatory Visit: Payer: Self-pay

## 2018-10-30 DIAGNOSIS — Z1159 Encounter for screening for other viral diseases: Secondary | ICD-10-CM | POA: Diagnosis not present

## 2018-10-30 DIAGNOSIS — Z01812 Encounter for preprocedural laboratory examination: Secondary | ICD-10-CM | POA: Insufficient documentation

## 2018-10-30 LAB — SARS CORONAVIRUS 2 (TAT 6-24 HRS): SARS Coronavirus 2: NEGATIVE

## 2018-10-30 NOTE — Progress Notes (Addendum)
Reviewed Pt's chart with Dr. Sabra Heck - BMI 52.72 and pt is schizophrenic - needs to move to main hospital. Spoke with Elmo Putt at Dr. Levell July office to have pt moved to main. Called patient and told him he would be done at [facility].

## 2018-10-30 NOTE — Telephone Encounter (Signed)
He was evaluated yesterday, to have surgery soon

## 2018-10-31 ENCOUNTER — Encounter: Payer: Self-pay | Admitting: Orthopedic Surgery

## 2018-10-31 ENCOUNTER — Encounter (HOSPITAL_COMMUNITY): Payer: Self-pay | Admitting: *Deleted

## 2018-10-31 ENCOUNTER — Other Ambulatory Visit: Payer: Self-pay

## 2018-10-31 MED ORDER — DEXTROSE 5 % IV SOLN
3.0000 g | INTRAVENOUS | Status: AC
  Administered 2018-11-01: 3 g via INTRAVENOUS
  Filled 2018-10-31: qty 3000
  Filled 2018-10-31: qty 3

## 2018-10-31 NOTE — Progress Notes (Signed)
Patient informed of the Lillian that is currently in effect.  Patient verbalized understanding.  Patient denies shortness of breath, fever, cough and chest pain.  PCP - Dr Burna Sis, [place] Cardiologist - Denies Psychiatric - Dr. Letitia Caul at [facility]  Chest x-ray - Denies EKG - Denies Stress Test - Denies ECHO - Denies Cardiac Cath - Denies  Sleep Study - Denies CPAP - N/A  ERAS:  Clears til 11:45 am DOS, no drink.  Anesthesia review: Spoke with Ebony Hail, [place] concerning BMI 52.72 and HTN.  Will get EKG on DOS.  STOP now taking any Aspirin (unless otherwise instructed by your surgeon), Aleve, Naproxen, Ibuprofen, Motrin, Advil, Goody's, BC's, all herbal medications, fish oil, and all vitamins.  Coronavirus Screening Have you or your Step-Father experienced the following symptoms:  Cough yes/no: No Fever (>100.40F)  yes/no: No Runny nose yes/no: No Sore throat yes/no: No Difficulty breathing/shortness of breath  yes/no: No  Have you or your step-father traveled in the last 14 days and where? yes/no: No

## 2018-11-01 ENCOUNTER — Ambulatory Visit (HOSPITAL_COMMUNITY): Payer: Medicare Other | Admitting: Certified Registered Nurse Anesthetist

## 2018-11-01 ENCOUNTER — Other Ambulatory Visit: Payer: Self-pay

## 2018-11-01 ENCOUNTER — Encounter (HOSPITAL_COMMUNITY): Payer: Self-pay

## 2018-11-01 ENCOUNTER — Ambulatory Visit (HOSPITAL_COMMUNITY)
Admission: RE | Admit: 2018-11-01 | Discharge: 2018-11-01 | Disposition: A | Discharge status: Home / Self Care | Payer: Medicare Other | Attending: Orthopedic Surgery | Admitting: Orthopedic Surgery

## 2018-11-01 ENCOUNTER — Encounter (HOSPITAL_COMMUNITY)
Admission: RE | Disposition: A | Discharge status: Home / Self Care | Payer: Self-pay | Source: Home / Self Care | Attending: Orthopedic Surgery

## 2018-11-01 DIAGNOSIS — Z6841 Body Mass Index (BMI) 40.0 and over, adult: Secondary | ICD-10-CM | POA: Insufficient documentation

## 2018-11-01 DIAGNOSIS — F209 Schizophrenia, unspecified: Secondary | ICD-10-CM | POA: Insufficient documentation

## 2018-11-01 DIAGNOSIS — S62324A Displaced fracture of shaft of fourth metacarpal bone, right hand, initial encounter for closed fracture: Secondary | ICD-10-CM | POA: Diagnosis not present

## 2018-11-01 DIAGNOSIS — F17210 Nicotine dependence, cigarettes, uncomplicated: Secondary | ICD-10-CM | POA: Diagnosis not present

## 2018-11-01 DIAGNOSIS — S62316A Displaced fracture of base of fifth metacarpal bone, right hand, initial encounter for closed fracture: Secondary | ICD-10-CM | POA: Diagnosis not present

## 2018-11-01 DIAGNOSIS — S62304A Unspecified fracture of fourth metacarpal bone, right hand, initial encounter for closed fracture: Secondary | ICD-10-CM | POA: Diagnosis not present

## 2018-11-01 DIAGNOSIS — I10 Essential (primary) hypertension: Secondary | ICD-10-CM | POA: Insufficient documentation

## 2018-11-01 DIAGNOSIS — S62306A Unspecified fracture of fifth metacarpal bone, right hand, initial encounter for closed fracture: Secondary | ICD-10-CM | POA: Diagnosis not present

## 2018-11-01 DIAGNOSIS — X58XXXA Exposure to other specified factors, initial encounter: Secondary | ICD-10-CM | POA: Insufficient documentation

## 2018-11-01 HISTORY — PX: OPEN REDUCTION INTERNAL FIXATION (ORIF) METACARPAL: SHX6234

## 2018-11-01 HISTORY — DX: Essential (primary) hypertension: I10

## 2018-11-01 HISTORY — PX: CLOSED REDUCTION METACARPAL WITH PERCUTANEOUS PINNING: SHX5613

## 2018-11-01 HISTORY — DX: Schizophrenia, unspecified: F20.9

## 2018-11-01 SURGERY — OPEN REDUCTION INTERNAL FIXATION (ORIF) METACARPAL
Anesthesia: Monitor Anesthesia Care | Laterality: Right

## 2018-11-01 MED ORDER — HYDROCODONE-ACETAMINOPHEN 5-325 MG PO TABS: 1.0000 | ORAL_TABLET | Freq: Four times a day (QID) | ORAL | 0 refills | Status: DC | PRN

## 2018-11-01 MED ORDER — ONDANSETRON HCL 4 MG/2ML IJ SOLN
INTRAMUSCULAR | Status: DC | PRN
  Administered 2018-11-01: 4 mg via INTRAVENOUS

## 2018-11-01 MED ORDER — HYDROMORPHONE HCL 1 MG/ML IJ SOLN: 0.2500 mg | INTRAMUSCULAR | Status: DC | PRN

## 2018-11-01 MED ORDER — ACETAMINOPHEN 500 MG PO TABS
1000.0000 mg | ORAL_TABLET | Freq: Once | ORAL | Status: AC
  Administered 2018-11-01: 1000 mg via ORAL
  Filled 2018-11-01 (×2): qty 2

## 2018-11-01 MED ORDER — CHLORHEXIDINE GLUCONATE 4 % EX LIQD: 60.0000 mL | Freq: Once | CUTANEOUS | Status: DC

## 2018-11-01 MED ORDER — MIDAZOLAM HCL 2 MG/2ML IJ SOLN
INTRAMUSCULAR | Status: AC
  Administered 2018-11-01: 14:00:00 2 mg via INTRAVENOUS
  Filled 2018-11-01: qty 2

## 2018-11-01 MED ORDER — LACTATED RINGERS IV SOLN
INTRAVENOUS | Status: DC
  Administered 2018-11-01: 14:00:00 via INTRAVENOUS

## 2018-11-01 MED ORDER — MIDAZOLAM HCL 2 MG/2ML IJ SOLN
2.0000 mg | Freq: Once | INTRAMUSCULAR | Status: AC
  Administered 2018-11-01: 2 mg via INTRAVENOUS

## 2018-11-01 MED ORDER — 0.9 % SODIUM CHLORIDE (POUR BTL) OPTIME
TOPICAL | Status: DC | PRN
  Administered 2018-11-01: 1000 mL

## 2018-11-01 MED ORDER — BUPIVACAINE HCL (PF) 0.5 % IJ SOLN
INTRAMUSCULAR | Status: DC | PRN
  Administered 2018-11-01: 30 mL via PERINEURAL

## 2018-11-01 MED ORDER — FENTANYL CITRATE (PF) 100 MCG/2ML IJ SOLN
INTRAMUSCULAR | Status: AC
  Administered 2018-11-01: 100 ug via INTRAVENOUS
  Filled 2018-11-01: qty 2

## 2018-11-01 MED ORDER — FENTANYL CITRATE (PF) 250 MCG/5ML IJ SOLN
INTRAMUSCULAR | Status: DC | PRN
  Administered 2018-11-01: 50 ug via INTRAVENOUS

## 2018-11-01 MED ORDER — FENTANYL CITRATE (PF) 100 MCG/2ML IJ SOLN
100.0000 ug | Freq: Once | INTRAMUSCULAR | Status: AC
  Administered 2018-11-01: 100 ug via INTRAVENOUS

## 2018-11-01 MED ORDER — PROPOFOL 10 MG/ML IV BOLUS
INTRAVENOUS | Status: DC | PRN
  Administered 2018-11-01: 20 mg via INTRAVENOUS
  Administered 2018-11-01: 30 mg via INTRAVENOUS

## 2018-11-01 MED ORDER — PROPOFOL 500 MG/50ML IV EMUL
INTRAVENOUS | Status: DC | PRN
  Administered 2018-11-01: 100 ug/kg/min via INTRAVENOUS

## 2018-11-01 SURGICAL SUPPLY — 54 items
BANDAGE ACE 3X5.8 VEL STRL LF (GAUZE/BANDAGES/DRESSINGS) ×3 IMPLANT
BANDAGE ACE 4X5 VEL STRL LF (GAUZE/BANDAGES/DRESSINGS) ×3 IMPLANT
BIT DRILL 1.1 MINI (BIT) IMPLANT
BLADE CLIPPER SURG (BLADE) IMPLANT
BNDG CMPR 9X4 STRL LF SNTH (GAUZE/BANDAGES/DRESSINGS) ×1
BNDG ESMARK 4X9 LF (GAUZE/BANDAGES/DRESSINGS) ×3 IMPLANT
BNDG GAUZE ELAST 4 BULKY (GAUZE/BANDAGES/DRESSINGS) ×3 IMPLANT
CORD BIPOLAR FORCEPS 12FT (ELECTRODE) ×3 IMPLANT
COVER SURGICAL LIGHT HANDLE (MISCELLANEOUS) ×3 IMPLANT
COVER WAND RF STERILE (DRAPES) ×1 IMPLANT
CUFF TOURN SGL QUICK 24 (TOURNIQUET CUFF) ×3
CUFF TRNQT CYL 24X4X16.5-23 (TOURNIQUET CUFF) IMPLANT
DRAIN TLS ROUND 10FR (DRAIN) IMPLANT
DRAPE OEC MINIVIEW 54X84 (DRAPES) ×2 IMPLANT
DRAPE SURG 17X23 STRL (DRAPES) ×3 IMPLANT
DRILL BIT 1.1 MINI (BIT) ×6
DRSG XEROFORM 1X8 (GAUZE/BANDAGES/DRESSINGS) ×2 IMPLANT
GAUZE SPONGE 4X4 12PLY STRL (GAUZE/BANDAGES/DRESSINGS) ×3 IMPLANT
GAUZE XEROFORM 1X8 LF (GAUZE/BANDAGES/DRESSINGS) ×3 IMPLANT
GLOVE BIO SURGEON STRL SZ7.5 (GLOVE) ×3 IMPLANT
GLOVE BIOGEL PI IND STRL 8 (GLOVE) ×1 IMPLANT
GLOVE BIOGEL PI INDICATOR 8 (GLOVE) ×2
GOWN STRL REUS W/ TWL LRG LVL3 (GOWN DISPOSABLE) ×3 IMPLANT
GOWN STRL REUS W/ TWL XL LVL3 (GOWN DISPOSABLE) ×3 IMPLANT
GOWN STRL REUS W/TWL LRG LVL3 (GOWN DISPOSABLE) ×9
GOWN STRL REUS W/TWL XL LVL3 (GOWN DISPOSABLE) ×9
K-WIRE .045X4 (WIRE) ×4 IMPLANT
KIT BASIN OR (CUSTOM PROCEDURE TRAY) ×3 IMPLANT
KIT TURNOVER KIT B (KITS) ×3 IMPLANT
MANIFOLD NEPTUNE II (INSTRUMENTS) ×3 IMPLANT
NEEDLE 22X1 1/2 (OR ONLY) (NEEDLE) IMPLANT
NS IRRIG 1000ML POUR BTL (IV SOLUTION) ×3 IMPLANT
PACK ORTHO EXTREMITY (CUSTOM PROCEDURE TRAY) ×3 IMPLANT
PAD ARMBOARD 7.5X6 YLW CONV (MISCELLANEOUS) ×6 IMPLANT
PAD CAST 4YDX4 CTTN HI CHSV (CAST SUPPLIES) ×1 IMPLANT
PADDING CAST COTTON 4X4 STRL (CAST SUPPLIES) ×3
PLATE STR 6H 29MM (Plate) ×2 IMPLANT
SCREW 1.5X10MM (Screw) ×8 IMPLANT
SCREW 1.5X12MM (Screw) ×4 IMPLANT
SLING ARM FOAM STRAP XLG (SOFTGOODS) ×2 IMPLANT
SPONGE LAP 4X18 RFD (DISPOSABLE) IMPLANT
SUT CHROMIC 4 0 P 3 18 (SUTURE) ×2 IMPLANT
SUT ETHILON 4 0 PS 2 18 (SUTURE) ×2 IMPLANT
SUT MNCRL AB 4-0 PS2 18 (SUTURE) ×3 IMPLANT
SUT VIC AB 3-0 FS2 27 (SUTURE) IMPLANT
SUT VIC AB 4-0 PS2 18 (SUTURE) ×2 IMPLANT
SYR CONTROL 10ML LL (SYRINGE) IMPLANT
SYSTEM CHEST DRAIN TLS 7FR (DRAIN) IMPLANT
TOWEL GREEN STERILE (TOWEL DISPOSABLE) ×3 IMPLANT
TOWEL GREEN STERILE FF (TOWEL DISPOSABLE) ×3 IMPLANT
TUBE CONNECTING 12'X1/4 (SUCTIONS) ×1
TUBE CONNECTING 12X1/4 (SUCTIONS) ×2 IMPLANT
TUBE EVACUATION TLS (MISCELLANEOUS) ×3 IMPLANT
UNDERPAD 30X30 (UNDERPADS AND DIAPERS) ×3 IMPLANT

## 2018-11-01 NOTE — Transfer of Care (Signed)
Immediate Anesthesia Transfer of Care Note  Patient: Grae Cannata  Procedure(s) Performed: OPEN REDUCTION INTERNAL FIXATION (ORIF) RIGHT RING METACARPAL (Right ) CLOSED PINNING POSSIBE OPEN REDUCTION INTERNAL FIXATION SMALL METACARPAL OPEN REDUCTION FIXATION (Right )  Patient Location: PACU  Anesthesia Type:MAC and Regional  Level of Consciousness: awake, alert  and oriented  Airway & Oxygen Therapy: Patient Spontanous Breathing and Patient connected to face mask oxygen  Post-op Assessment: Report given to RN and Post -op Vital signs reviewed and stable  Post vital signs: Reviewed and stable  Last Vitals:  Vitals Value Taken Time  BP 156/109 11/01/18 1523  Temp    Pulse 88 11/01/18 1523  Resp 25 11/01/18 1523  SpO2 99 % 11/01/18 1523  Vitals shown include unvalidated device data.  Last Pain:  Vitals:   11/01/18 1320  TempSrc:   PainSc: 0-No pain         Complications: No apparent anesthesia complications

## 2018-11-01 NOTE — Op Note (Signed)
I assisted Surgeon(s) and Role:    * Daryll Brod, MD - Primary    Leanora Cover, MD - Assisting on the Procedure(s): OPEN REDUCTION INTERNAL FIXATION (ORIF) RIGHT RING METACARPAL CLOSED PINNING POSSIBE OPEN REDUCTION INTERNAL FIXATION SMALL METACARPAL OPEN REDUCTION FIXATION on 11/01/2018.  I provided assistance on this case as follows: retraction soft tissues, cleaning and reduction fracture, placement hardware.  Electronically signed by: Leanora Cover, MD Date: 11/01/2018 Time: 3:16 PM
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
## 2018-11-01 NOTE — Anesthesia Procedure Notes (Signed)
Anesthesia Regional Block: Supraclavicular block   Pre-Anesthetic Checklist: ,, timeout performed, Correct Patient, Correct Site, Correct Laterality, Correct Procedure, Correct Position, site marked, Risks and benefits discussed, pre-op evaluation,  At surgeon's request and post-op pain management  Laterality: Right  Prep: Maximum Sterile Barrier Precautions used, chloraprep       Needles:  Injection technique: Single-shot  Needle Type: Echogenic Stimulator Needle     Needle Length: 9cm  Needle Gauge: 22     Additional Needles:   Procedures:,,,, ultrasound used (permanent image in chart),,,,  Narrative:  Start time: 11/01/2018 1:34 PM End time: 11/01/2018 1:44 PM Injection made incrementally with aspirations every 5 mL. Anesthesiologist: Roderic Palau, MD  Additional Notes: 2% Lidocaine skin wheel.
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
## 2018-11-01 NOTE — Anesthesia Preprocedure Evaluation (Addendum)
Anesthesia Evaluation  Patient identified by MRN, date of birth, ID band Patient awake    Reviewed: Allergy & Precautions, H&P , NPO status , Patient's Chart, lab work & pertinent test results  Airway Mallampati: III  TM Distance: >3 FB Neck ROM: Full    Dental no notable dental hx. (+) Teeth Intact, Dental Advisory Given   Pulmonary Current Smoker,    Pulmonary exam normal breath sounds clear to auscultation       Cardiovascular hypertension, Pt. on medications  Rhythm:Regular Rate:Normal     Neuro/Psych Schizophrenia negative neurological ROS     GI/Hepatic negative GI ROS, Neg liver ROS,   Endo/Other  Morbid obesity  Renal/GU negative Renal ROS  negative genitourinary   Musculoskeletal   Abdominal   Peds  Hematology negative hematology ROS (+)   Anesthesia Other Findings   Reproductive/Obstetrics negative OB ROS                            Anesthesia Physical Anesthesia Plan  ASA: III  Anesthesia Plan: MAC and Regional   Post-op Pain Management:    Induction: Intravenous  PONV Risk Score and Plan: 0 and Propofol infusion, Midazolam and Ondansetron  Airway Management Planned: Simple Face Mask  Additional Equipment:   Intra-op Plan:   Post-operative Plan:   Informed Consent: I have reviewed the patients History and Physical, chart, labs and discussed the procedure including the risks, benefits and alternatives for the proposed anesthesia with the patient or authorized representative who has indicated his/her understanding and acceptance.     Dental advisory given  Plan Discussed with: CRNA  Anesthesia Plan Comments:         Anesthesia Quick Evaluation

## 2018-11-01 NOTE — Anesthesia Postprocedure Evaluation (Signed)
Anesthesia Post Note  Patient: Einer Meals  Procedure(s) Performed: OPEN REDUCTION INTERNAL FIXATION (ORIF) RIGHT RING METACARPAL (Right ) CLOSED PINNING POSSIBE OPEN REDUCTION INTERNAL FIXATION SMALL METACARPAL OPEN REDUCTION FIXATION (Right )     Patient location during evaluation: PACU Anesthesia Type: Regional and MAC Level of consciousness: awake and alert Pain management: pain level controlled Vital Signs Assessment: post-procedure vital signs reviewed and stable Respiratory status: spontaneous breathing, nonlabored ventilation and respiratory function stable Cardiovascular status: stable and blood pressure returned to baseline Postop Assessment: no apparent nausea or vomiting Anesthetic complications: no    Last Vitals:  Vitals:   11/01/18 1538 11/01/18 1553  BP: (!) 147/93 (!) 154/93  Pulse: 81 96  Resp: (!) 21 (!) 24  Temp: 37 C 36.9 C  SpO2: 96% 96%    Last Pain:  Vitals:   11/01/18 1553  TempSrc:   PainSc: 0-No pain                 Demon Volante,W. EDMOND

## 2018-11-01 NOTE — Discharge Instructions (Signed)

## 2018-11-01 NOTE — Brief Op Note (Signed)
11/01/2018  3:16 PM  PATIENT:  Joseph Gallegos  48 y.o. male  PRE-OPERATIVE DIAGNOSIS:  FRACTURE METACARPAL RIGHT RING SMALL FINGER  POST-OPERATIVE DIAGNOSIS:  FRACTURE METACARPAL RIGHT RING SMALL FINGER  PROCEDURE:  Procedure(s) with comments: OPEN REDUCTION INTERNAL FIXATION (ORIF) RIGHT RING METACARPAL (Right) - AXILLARY BLOCK CLOSED PINNING POSSIBE OPEN REDUCTION INTERNAL FIXATION SMALL METACARPAL OPEN REDUCTION FIXATION (Right)  SURGEON:  Surgeon(s) and Role:    * Daryll Brod, MD - Primary    * Leanora Cover, MD - Assisting  PHYSICIAN ASSISTANT:   ASSISTANTS: K Thad Osoria,MD   ANESTHESIA:   regional and IV sedation  EBL: 60ml  BLOOD ADMINISTERED:none  DRAINS: none   LOCAL MEDICATIONS USED:  NONE  SPECIMEN:  No Specimen  DISPOSITION OF SPECIMEN:  N/A  COUNTS:  YES  TOURNIQUET:   Total Tourniquet Time Documented: Upper Arm (Right) - 52 minutes Total: Upper Arm (Right) - 52 minutes   DICTATION: .Viviann Spare Dictation  PLAN OF CARE: Discharge to home after PACU  PATIENT DISPOSITION:  PACU - hemodynamically stable.

## 2018-11-05 ENCOUNTER — Encounter (HOSPITAL_COMMUNITY): Payer: Self-pay | Admitting: Orthopedic Surgery

## 2018-11-07 DIAGNOSIS — S62324D Displaced fracture of shaft of fourth metacarpal bone, right hand, subsequent encounter for fracture with routine healing: Secondary | ICD-10-CM | POA: Diagnosis not present

## 2018-11-07 DIAGNOSIS — S62316D Displaced fracture of base of fifth metacarpal bone, right hand, subsequent encounter for fracture with routine healing: Secondary | ICD-10-CM | POA: Diagnosis not present

## 2018-11-07 DIAGNOSIS — M25641 Stiffness of right hand, not elsewhere classified: Secondary | ICD-10-CM | POA: Diagnosis not present

## 2018-11-23 DIAGNOSIS — F209 Schizophrenia, unspecified: Secondary | ICD-10-CM | POA: Diagnosis not present

## 2018-11-26 DIAGNOSIS — S62316D Displaced fracture of base of fifth metacarpal bone, right hand, subsequent encounter for fracture with routine healing: Secondary | ICD-10-CM | POA: Diagnosis not present

## 2018-11-26 DIAGNOSIS — M25641 Stiffness of right hand, not elsewhere classified: Secondary | ICD-10-CM | POA: Diagnosis not present

## 2018-11-26 DIAGNOSIS — S62316A Displaced fracture of base of fifth metacarpal bone, right hand, initial encounter for closed fracture: Secondary | ICD-10-CM | POA: Diagnosis not present

## 2018-11-26 DIAGNOSIS — S62324D Displaced fracture of shaft of fourth metacarpal bone, right hand, subsequent encounter for fracture with routine healing: Secondary | ICD-10-CM | POA: Diagnosis not present

## 2018-12-03 DIAGNOSIS — S62324D Displaced fracture of shaft of fourth metacarpal bone, right hand, subsequent encounter for fracture with routine healing: Secondary | ICD-10-CM | POA: Diagnosis not present

## 2018-12-17 DIAGNOSIS — S62324D Displaced fracture of shaft of fourth metacarpal bone, right hand, subsequent encounter for fracture with routine healing: Secondary | ICD-10-CM | POA: Diagnosis not present

## 2018-12-17 DIAGNOSIS — S62316A Displaced fracture of base of fifth metacarpal bone, right hand, initial encounter for closed fracture: Secondary | ICD-10-CM | POA: Diagnosis not present

## 2018-12-20 DIAGNOSIS — F209 Schizophrenia, unspecified: Secondary | ICD-10-CM | POA: Diagnosis not present

## 2019-01-16 DIAGNOSIS — S62316D Displaced fracture of base of fifth metacarpal bone, right hand, subsequent encounter for fracture with routine healing: Secondary | ICD-10-CM | POA: Diagnosis not present

## 2019-01-16 DIAGNOSIS — S62324D Displaced fracture of shaft of fourth metacarpal bone, right hand, subsequent encounter for fracture with routine healing: Secondary | ICD-10-CM | POA: Diagnosis not present

## 2019-01-24 DIAGNOSIS — F209 Schizophrenia, unspecified: Secondary | ICD-10-CM | POA: Diagnosis not present

## 2019-01-31 DIAGNOSIS — F209 Schizophrenia, unspecified: Secondary | ICD-10-CM | POA: Diagnosis not present

## 2019-02-18 DIAGNOSIS — S62316D Displaced fracture of base of fifth metacarpal bone, right hand, subsequent encounter for fracture with routine healing: Secondary | ICD-10-CM | POA: Diagnosis not present

## 2019-02-18 DIAGNOSIS — S62324D Displaced fracture of shaft of fourth metacarpal bone, right hand, subsequent encounter for fracture with routine healing: Secondary | ICD-10-CM | POA: Diagnosis not present

## 2019-02-21 DIAGNOSIS — F209 Schizophrenia, unspecified: Secondary | ICD-10-CM | POA: Diagnosis not present

## 2019-03-21 DIAGNOSIS — F209 Schizophrenia, unspecified: Secondary | ICD-10-CM | POA: Diagnosis not present

## 2019-04-18 DIAGNOSIS — F209 Schizophrenia, unspecified: Secondary | ICD-10-CM | POA: Diagnosis not present

## 2019-05-16 DIAGNOSIS — F209 Schizophrenia, unspecified: Secondary | ICD-10-CM | POA: Diagnosis not present

## 2019-05-23 DIAGNOSIS — F209 Schizophrenia, unspecified: Secondary | ICD-10-CM | POA: Diagnosis not present

## 2019-06-13 DIAGNOSIS — F209 Schizophrenia, unspecified: Secondary | ICD-10-CM | POA: Diagnosis not present

## 2019-07-11 DIAGNOSIS — F209 Schizophrenia, unspecified: Secondary | ICD-10-CM | POA: Diagnosis not present

## 2019-08-08 DIAGNOSIS — F209 Schizophrenia, unspecified: Secondary | ICD-10-CM | POA: Diagnosis not present

## 2019-08-30 DIAGNOSIS — S62324D Displaced fracture of shaft of fourth metacarpal bone, right hand, subsequent encounter for fracture with routine healing: Secondary | ICD-10-CM | POA: Diagnosis not present

## 2019-08-30 DIAGNOSIS — S62316D Displaced fracture of base of fifth metacarpal bone, right hand, subsequent encounter for fracture with routine healing: Secondary | ICD-10-CM | POA: Diagnosis not present

## 2019-09-05 DIAGNOSIS — F209 Schizophrenia, unspecified: Secondary | ICD-10-CM | POA: Diagnosis not present

## 2019-09-20 DIAGNOSIS — F209 Schizophrenia, unspecified: Secondary | ICD-10-CM | POA: Diagnosis not present

## 2019-10-28 ENCOUNTER — Inpatient Hospital Stay (HOSPITAL_COMMUNITY)
Admission: EM | Admit: 2019-10-28 | Discharge: 2019-10-29 | DRG: 394 | Discharge status: Against Medical Advice | Payer: Medicare Other | Attending: Family Medicine | Admitting: Family Medicine

## 2019-10-28 ENCOUNTER — Other Ambulatory Visit: Payer: Self-pay

## 2019-10-28 ENCOUNTER — Encounter (HOSPITAL_COMMUNITY): Payer: Self-pay | Admitting: *Deleted

## 2019-10-28 ENCOUNTER — Emergency Department (HOSPITAL_COMMUNITY): Payer: Medicare Other

## 2019-10-28 DIAGNOSIS — K631 Perforation of intestine (nontraumatic): Secondary | ICD-10-CM | POA: Diagnosis not present

## 2019-10-28 DIAGNOSIS — I700 Atherosclerosis of aorta: Secondary | ICD-10-CM | POA: Diagnosis not present

## 2019-10-28 DIAGNOSIS — F209 Schizophrenia, unspecified: Secondary | ICD-10-CM | POA: Diagnosis present

## 2019-10-28 DIAGNOSIS — Z6841 Body Mass Index (BMI) 40.0 and over, adult: Secondary | ICD-10-CM

## 2019-10-28 DIAGNOSIS — F17210 Nicotine dependence, cigarettes, uncomplicated: Secondary | ICD-10-CM | POA: Diagnosis present

## 2019-10-28 DIAGNOSIS — Z20822 Contact with and (suspected) exposure to covid-19: Secondary | ICD-10-CM | POA: Diagnosis not present

## 2019-10-28 DIAGNOSIS — K6389 Other specified diseases of intestine: Secondary | ICD-10-CM | POA: Diagnosis not present

## 2019-10-28 DIAGNOSIS — K56609 Unspecified intestinal obstruction, unspecified as to partial versus complete obstruction: Secondary | ICD-10-CM | POA: Diagnosis present

## 2019-10-28 DIAGNOSIS — I10 Essential (primary) hypertension: Secondary | ICD-10-CM | POA: Diagnosis present

## 2019-10-28 DIAGNOSIS — R198 Other specified symptoms and signs involving the digestive system and abdomen: Secondary | ICD-10-CM

## 2019-10-28 DIAGNOSIS — R6521 Severe sepsis with septic shock: Secondary | ICD-10-CM | POA: Diagnosis not present

## 2019-10-28 DIAGNOSIS — K668 Other specified disorders of peritoneum: Secondary | ICD-10-CM | POA: Diagnosis not present

## 2019-10-28 DIAGNOSIS — Z5329 Procedure and treatment not carried out because of patient's decision for other reasons: Secondary | ICD-10-CM | POA: Diagnosis present

## 2019-10-28 DIAGNOSIS — Z79899 Other long term (current) drug therapy: Secondary | ICD-10-CM

## 2019-10-28 DIAGNOSIS — E1165 Type 2 diabetes mellitus with hyperglycemia: Secondary | ICD-10-CM | POA: Diagnosis not present

## 2019-10-28 DIAGNOSIS — N281 Cyst of kidney, acquired: Secondary | ICD-10-CM | POA: Diagnosis not present

## 2019-10-28 DIAGNOSIS — E872 Acidosis: Secondary | ICD-10-CM | POA: Diagnosis not present

## 2019-10-28 LAB — URINALYSIS, ROUTINE W REFLEX MICROSCOPIC
Bacteria, UA: NONE SEEN
Bilirubin Urine: NEGATIVE
Glucose, UA: >=500 mg/dL — AB
Ketones, ur: 20 mg/dL — AB
Leukocytes,Ua: NEGATIVE
Nitrite: NEGATIVE
Protein, ur: NEGATIVE mg/dL
Specific Gravity, Urine: 1.022 (ref 1.005–1.030)
pH: 5 (ref 5.0–8.0)

## 2019-10-28 LAB — COMPREHENSIVE METABOLIC PANEL
ALT: 72 U/L — ABNORMAL HIGH (ref 0–44)
AST: 41 U/L (ref 15–41)
Albumin: 3.8 g/dL (ref 3.5–5.0)
Alkaline Phosphatase: 72 U/L (ref 38–126)
Anion gap: >20 — ABNORMAL HIGH (ref 5–15)
BUN: 11 mg/dL (ref 6–20)
CO2: 22 mmol/L (ref 22–32)
Calcium: 8.7 mg/dL — ABNORMAL LOW (ref 8.9–10.3)
Chloride: 82 mmol/L — ABNORMAL LOW (ref 98–111)
Creatinine, Ser: 1.35 mg/dL — ABNORMAL HIGH (ref 0.61–1.24)
GFR calc Af Amer: >60 mL/min (ref >60)
GFR calc non Af Amer: >60 mL/min (ref >60)
Glucose, Bld: 475 mg/dL — ABNORMAL HIGH (ref 70–99)
Potassium: 3.5 mmol/L (ref 3.5–5.1)
Sodium: 127 mmol/L — ABNORMAL LOW (ref 135–145)
Total Bilirubin: 1.8 mg/dL — ABNORMAL HIGH (ref 0.3–1.2)
Total Protein: 6.7 g/dL (ref 6.5–8.1)

## 2019-10-28 LAB — RAPID URINE DRUG SCREEN, HOSP PERFORMED
Amphetamines: NOT DETECTED
Barbiturates: NOT DETECTED
Benzodiazepines: NOT DETECTED
Cocaine: NOT DETECTED
Opiates: NOT DETECTED
Tetrahydrocannabinol: NOT DETECTED

## 2019-10-28 LAB — LIPASE, BLOOD: Lipase: 21 U/L (ref 11–51)

## 2019-10-28 LAB — CBC
HCT: 46.5 % (ref 39.0–52.0)
Hemoglobin: 15.2 g/dL (ref 13.0–17.0)
MCH: 27.1 pg (ref 26.0–34.0)
MCHC: 32.7 g/dL (ref 30.0–36.0)
MCV: 83 fL (ref 80.0–100.0)
Platelets: 234 10*3/uL (ref 150–400)
RBC: 5.6 MIL/uL (ref 4.22–5.81)
RDW: 13 % (ref 11.5–15.5)
WBC: 11 10*3/uL — ABNORMAL HIGH (ref 4.0–10.5)
nRBC: 0 % (ref 0.0–0.2)

## 2019-10-28 LAB — CBG MONITORING, ED: Glucose-Capillary: 519 mg/dL (ref 70–99)

## 2019-10-28 LAB — ETHANOL: Alcohol, Ethyl (B): <10 mg/dL (ref <10)

## 2019-10-28 MED ORDER — MORPHINE SULFATE (PF) 4 MG/ML IV SOLN
4.0000 mg | Freq: Once | INTRAVENOUS | Status: AC
  Administered 2019-10-28: 4 mg via INTRAVENOUS
  Filled 2019-10-28: qty 1

## 2019-10-28 MED ORDER — SODIUM CHLORIDE 0.9 % IV SOLN
2.0000 g | INTRAVENOUS | Status: DC
  Filled 2019-10-28: qty 2

## 2019-10-28 MED ORDER — SODIUM CHLORIDE 0.9 % IV SOLN: INTRAVENOUS | Status: DC

## 2019-10-28 MED ORDER — DEXTROSE 50 % IV SOLN: 0.0000 mL | INTRAVENOUS | Status: DC | PRN

## 2019-10-28 MED ORDER — ONDANSETRON HCL 4 MG/2ML IJ SOLN
4.0000 mg | Freq: Once | INTRAMUSCULAR | Status: AC
  Administered 2019-10-28: 4 mg via INTRAVENOUS
  Filled 2019-10-28: qty 2

## 2019-10-28 MED ORDER — CHLORHEXIDINE GLUCONATE CLOTH 2 % EX PADS
6.0000 | MEDICATED_PAD | Freq: Once | CUTANEOUS | Status: AC
  Administered 2019-10-28: 6 via TOPICAL

## 2019-10-28 MED ORDER — INSULIN REGULAR(HUMAN) IN NACL 100-0.9 UT/100ML-% IV SOLN
INTRAVENOUS | Status: DC
  Administered 2019-10-28: 15 [IU]/h via INTRAVENOUS
  Filled 2019-10-28: qty 100

## 2019-10-28 MED ORDER — SODIUM CHLORIDE 0.9% FLUSH
3.0000 mL | Freq: Once | INTRAVENOUS | Status: AC
  Administered 2019-10-28: 3 mL via INTRAVENOUS

## 2019-10-28 MED ORDER — PIPERACILLIN-TAZOBACTAM 3.375 G IVPB 30 MIN
3.3750 g | Freq: Once | INTRAVENOUS | Status: AC
  Administered 2019-10-28: 3.375 g via INTRAVENOUS
  Filled 2019-10-28: qty 50

## 2019-10-28 MED ORDER — INSULIN ASPART 100 UNIT/ML IV SOLN: 5.0000 [IU] | Freq: Once | INTRAVENOUS | Status: DC

## 2019-10-28 MED ORDER — IOHEXOL 300 MG/ML  SOLN
100.0000 mL | Freq: Once | INTRAMUSCULAR | Status: AC | PRN
  Administered 2019-10-28: 100 mL via INTRAVENOUS

## 2019-10-28 MED ORDER — DEXTROSE-NACL 5-0.45 % IV SOLN: INTRAVENOUS | Status: DC

## 2019-10-28 MED ORDER — SODIUM CHLORIDE 0.9 % IV BOLUS
1000.0000 mL | Freq: Once | INTRAVENOUS | Status: AC
  Administered 2019-10-28: 1000 mL via INTRAVENOUS

## 2019-10-28 MED ORDER — CHLORHEXIDINE GLUCONATE CLOTH 2 % EX PADS: 6.0000 | MEDICATED_PAD | Freq: Once | CUTANEOUS | Status: DC
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
## 2019-10-28 NOTE — ED Notes (Addendum)
Pt. Was sticking finger down throat to vomit. Educated pt. On the damage they can cause their esophagus. Pt. States it helps them feel better. Pt. Than proceeded to speak to their wrist asking if " they were put together right".
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
## 2019-10-28 NOTE — ED Notes (Signed)
Pt. With CT. 

## 2019-10-28 NOTE — Progress Notes (Addendum)
[facility]  Patient with cecal perforation likely from a colonic obstruction from a sigmoid mass. Minor tachycardia but no hypotension. CT reviewed and multiple small foci of free air and loculated fluid around the cecum. Is going to likely need to have a subtotal colectomy and end ileostomy.   BP 112/79 (BP Location: Right Arm)   Pulse (!) 112   Temp 99.9 F (37.7 C) (Oral)   Resp 20   Ht 5\' 11"  (1.803 m)   Wt (!) 172.4 kg   SpO2 98%   BMI 53.00 kg/m   No COVID test back yet.  Na 127. Will get the patient resuscitated with fluids, should get 1-2 L. Zosyn now. NPO. Hospitalist to admit. Plan for surgery at 6AM so COVID is back, resuscitation completed, and team ready for OR.   Will see patient before surgery and get consent. No reported cardiac history reported, old EKG with question possible old infact. Repeat EKG ordered.  Will place order for foley insertion too for preop and resuscitation. Please also get 2 working IVs.   Discussed with Dr. Briant Cedar (Anesthesia) and Dr. Sabra Heck (ED). Discussed with supervising RN. Appreciate everyone's assistance.   Curlene Labrum, MD [facility] [address] [place], [place] [address] [phone] (office)

## 2019-10-29 ENCOUNTER — Emergency Department (HOSPITAL_COMMUNITY): Payer: Medicare Other | Admitting: Anesthesiology

## 2019-10-29 ENCOUNTER — Encounter (HOSPITAL_COMMUNITY): Payer: Self-pay

## 2019-10-29 ENCOUNTER — Other Ambulatory Visit: Payer: Self-pay

## 2019-10-29 ENCOUNTER — Encounter (HOSPITAL_COMMUNITY): Admission: EM | Disposition: E | Payer: Self-pay | Source: Home / Self Care | Attending: Emergency Medicine

## 2019-10-29 ENCOUNTER — Emergency Department (HOSPITAL_COMMUNITY): Payer: Medicare Other

## 2019-10-29 ENCOUNTER — Emergency Department (HOSPITAL_COMMUNITY)
Admission: EM | Admit: 2019-10-29 | Discharge: 2019-10-31 | Disposition: E | Payer: Medicare Other | Attending: Emergency Medicine | Admitting: Emergency Medicine

## 2019-10-29 DIAGNOSIS — R404 Transient alteration of awareness: Secondary | ICD-10-CM | POA: Diagnosis not present

## 2019-10-29 DIAGNOSIS — F209 Schizophrenia, unspecified: Secondary | ICD-10-CM | POA: Diagnosis present

## 2019-10-29 DIAGNOSIS — R6521 Severe sepsis with septic shock: Secondary | ICD-10-CM | POA: Diagnosis present

## 2019-10-29 DIAGNOSIS — R0689 Other abnormalities of breathing: Secondary | ICD-10-CM | POA: Diagnosis not present

## 2019-10-29 DIAGNOSIS — R0902 Hypoxemia: Secondary | ICD-10-CM | POA: Diagnosis not present

## 2019-10-29 DIAGNOSIS — R0602 Shortness of breath: Secondary | ICD-10-CM | POA: Diagnosis not present

## 2019-10-29 DIAGNOSIS — K6389 Other specified diseases of intestine: Secondary | ICD-10-CM | POA: Diagnosis not present

## 2019-10-29 DIAGNOSIS — A419 Sepsis, unspecified organism: Secondary | ICD-10-CM | POA: Insufficient documentation

## 2019-10-29 DIAGNOSIS — K668 Other specified disorders of peritoneum: Secondary | ICD-10-CM | POA: Diagnosis present

## 2019-10-29 DIAGNOSIS — F1410 Cocaine abuse, uncomplicated: Secondary | ICD-10-CM | POA: Insufficient documentation

## 2019-10-29 DIAGNOSIS — I469 Cardiac arrest, cause unspecified: Secondary | ICD-10-CM | POA: Insufficient documentation

## 2019-10-29 DIAGNOSIS — Z5329 Procedure and treatment not carried out because of patient's decision for other reasons: Secondary | ICD-10-CM | POA: Diagnosis present

## 2019-10-29 DIAGNOSIS — K56609 Unspecified intestinal obstruction, unspecified as to partial versus complete obstruction: Secondary | ICD-10-CM

## 2019-10-29 DIAGNOSIS — Z20822 Contact with and (suspected) exposure to covid-19: Secondary | ICD-10-CM | POA: Insufficient documentation

## 2019-10-29 DIAGNOSIS — K631 Perforation of intestine (nontraumatic): Secondary | ICD-10-CM | POA: Diagnosis present

## 2019-10-29 DIAGNOSIS — Z6841 Body Mass Index (BMI) 40.0 and over, adult: Secondary | ICD-10-CM | POA: Diagnosis not present

## 2019-10-29 DIAGNOSIS — F17210 Nicotine dependence, cigarettes, uncomplicated: Secondary | ICD-10-CM | POA: Diagnosis present

## 2019-10-29 DIAGNOSIS — E872 Acidosis, unspecified: Secondary | ICD-10-CM | POA: Diagnosis present

## 2019-10-29 DIAGNOSIS — J80 Acute respiratory distress syndrome: Secondary | ICD-10-CM | POA: Diagnosis not present

## 2019-10-29 DIAGNOSIS — F1210 Cannabis abuse, uncomplicated: Secondary | ICD-10-CM | POA: Insufficient documentation

## 2019-10-29 DIAGNOSIS — Z79899 Other long term (current) drug therapy: Secondary | ICD-10-CM | POA: Diagnosis not present

## 2019-10-29 DIAGNOSIS — I10 Essential (primary) hypertension: Secondary | ICD-10-CM | POA: Diagnosis present

## 2019-10-29 DIAGNOSIS — J9601 Acute respiratory failure with hypoxia: Secondary | ICD-10-CM

## 2019-10-29 DIAGNOSIS — E1165 Type 2 diabetes mellitus with hyperglycemia: Secondary | ICD-10-CM | POA: Diagnosis not present

## 2019-10-29 DIAGNOSIS — R198 Other specified symptoms and signs involving the digestive system and abdomen: Secondary | ICD-10-CM

## 2019-10-29 LAB — CBC WITH DIFFERENTIAL/PLATELET
Abs Immature Granulocytes: 0.08 10*3/uL — ABNORMAL HIGH (ref 0.00–0.07)
Basophils Absolute: 0 10*3/uL (ref 0.0–0.1)
Basophils Relative: 1 %
Eosinophils Absolute: 0 10*3/uL (ref 0.0–0.5)
Eosinophils Relative: 0 %
HCT: 51.5 % (ref 39.0–52.0)
Hemoglobin: 15.6 g/dL (ref 13.0–17.0)
Immature Granulocytes: 1 %
Lymphocytes Relative: 13 %
Lymphs Abs: 0.9 10*3/uL (ref 0.7–4.0)
MCH: 27.4 pg (ref 26.0–34.0)
MCHC: 30.3 g/dL (ref 30.0–36.0)
MCV: 90.5 fL (ref 80.0–100.0)
Monocytes Absolute: 0.4 10*3/uL (ref 0.1–1.0)
Monocytes Relative: 6 %
Neutro Abs: 5.8 10*3/uL (ref 1.7–7.7)
Neutrophils Relative %: 79 %
Platelets: 289 10*3/uL (ref 150–400)
RBC: 5.69 MIL/uL (ref 4.22–5.81)
RDW: 13.3 % (ref 11.5–15.5)
WBC: 7.3 10*3/uL (ref 4.0–10.5)
nRBC: 0.3 % — ABNORMAL HIGH (ref 0.0–0.2)

## 2019-10-29 LAB — COMPREHENSIVE METABOLIC PANEL
ALT: 56 U/L — ABNORMAL HIGH (ref 0–44)
AST: 48 U/L — ABNORMAL HIGH (ref 15–41)
Albumin: 3.1 g/dL — ABNORMAL LOW (ref 3.5–5.0)
Alkaline Phosphatase: 67 U/L (ref 38–126)
BUN: 20 mg/dL (ref 6–20)
CO2: 17 mmol/L — ABNORMAL LOW (ref 22–32)
Calcium: 8.2 mg/dL — ABNORMAL LOW (ref 8.9–10.3)
Chloride: 80 mmol/L — ABNORMAL LOW (ref 98–111)
Creatinine, Ser: 3.31 mg/dL — ABNORMAL HIGH (ref 0.61–1.24)
GFR calc Af Amer: 24 mL/min — ABNORMAL LOW (ref >60)
GFR calc non Af Amer: 21 mL/min — ABNORMAL LOW (ref >60)
Glucose, Bld: 849 mg/dL (ref 70–99)
Potassium: 4.7 mmol/L (ref 3.5–5.1)
Sodium: 125 mmol/L — ABNORMAL LOW (ref 135–145)
Total Bilirubin: 1.2 mg/dL (ref 0.3–1.2)
Total Protein: 6.1 g/dL — ABNORMAL LOW (ref 6.5–8.1)

## 2019-10-29 LAB — CBG MONITORING, ED
Glucose-Capillary: 390 mg/dL — ABNORMAL HIGH (ref 70–99)
Glucose-Capillary: 402 mg/dL — ABNORMAL HIGH (ref 70–99)
Glucose-Capillary: 415 mg/dL — ABNORMAL HIGH (ref 70–99)
Glucose-Capillary: 489 mg/dL — ABNORMAL HIGH (ref 70–99)
Glucose-Capillary: 504 mg/dL (ref 70–99)

## 2019-10-29 LAB — SARS CORONAVIRUS 2 BY RT PCR (HOSPITAL ORDER, PERFORMED IN ~~LOC~~ HOSPITAL LAB)
SARS Coronavirus 2: NEGATIVE
SARS Coronavirus 2: NEGATIVE

## 2019-10-29 LAB — TYPE AND SCREEN
ABO/RH(D): B POS
ABO/RH(D): B POS
Antibody Screen: NEGATIVE
Antibody Screen: NEGATIVE

## 2019-10-29 LAB — BLOOD GAS, ARTERIAL
Acid-base deficit: 10.4 mmol/L — ABNORMAL HIGH (ref 0.0–2.0)
Bicarbonate: 14.2 mmol/L — ABNORMAL LOW (ref 20.0–28.0)
FIO2: 100
O2 Saturation: 75.9 %
Patient temperature: 37.6
pCO2 arterial: 62.3 mmHg — ABNORMAL HIGH (ref 32.0–48.0)
pH, Arterial: 7.088 — CL (ref 7.350–7.450)
pO2, Arterial: 60.2 mmHg — ABNORMAL LOW (ref 83.0–108.0)

## 2019-10-29 LAB — APTT: aPTT: 31 seconds (ref 24–36)

## 2019-10-29 LAB — LACTIC ACID, PLASMA: Lactic Acid, Venous: >11 mmol/L (ref 0.5–1.9)

## 2019-10-29 LAB — PROTIME-INR
INR: 1.6 — ABNORMAL HIGH (ref 0.8–1.2)
Prothrombin Time: 18.3 seconds — ABNORMAL HIGH (ref 11.4–15.2)

## 2019-10-29 SURGERY — LAPAROTOMY, EXPLORATORY
Anesthesia: General

## 2019-10-29 MED ORDER — ACETAMINOPHEN 325 MG PO TABS: 650.0000 mg | ORAL_TABLET | Freq: Four times a day (QID) | ORAL | Status: DC | PRN

## 2019-10-29 MED ORDER — GLYCOPYRROLATE 0.2 MG/ML IJ SOLN: 0.2000 mg | INTRAMUSCULAR | Status: DC | PRN

## 2019-10-29 MED ORDER — FENTANYL 2500MCG IN NS 250ML (10MCG/ML) PREMIX INFUSION: 0.0000 ug/h | INTRAVENOUS | Status: DC

## 2019-10-29 MED ORDER — ROCURONIUM BROMIDE 50 MG/5ML IV SOLN
INTRAVENOUS | Status: AC | PRN
  Administered 2019-10-29: 70 mg via INTRAVENOUS

## 2019-10-29 MED ORDER — MORPHINE SULFATE (PF) 2 MG/ML IV SOLN: 1.0000 mg | INTRAVENOUS | Status: DC | PRN

## 2019-10-29 MED ORDER — ONDANSETRON 4 MG PO TBDP: 4.0000 mg | ORAL_TABLET | Freq: Four times a day (QID) | ORAL | Status: DC | PRN

## 2019-10-29 MED ORDER — PIPERACILLIN-TAZOBACTAM 3.375 G IVPB 30 MIN
3.3750 g | Freq: Once | INTRAVENOUS | Status: AC
  Administered 2019-10-29: 3.375 g via INTRAVENOUS

## 2019-10-29 MED ORDER — DEXTROSE 50 % IV SOLN: 0.0000 mL | INTRAVENOUS | Status: DC | PRN

## 2019-10-29 MED ORDER — ROCURONIUM BROMIDE 10 MG/ML (PF) SYRINGE
PREFILLED_SYRINGE | INTRAVENOUS | Status: AC
  Filled 2019-10-29: qty 10

## 2019-10-29 MED ORDER — HALOPERIDOL LACTATE 2 MG/ML PO CONC
0.5000 mg | ORAL | Status: DC | PRN
  Filled 2019-10-29: qty 0.3

## 2019-10-29 MED ORDER — NOREPINEPHRINE 4 MG/250ML-% IV SOLN
0.0000 ug/min | INTRAVENOUS | Status: DC
  Administered 2019-10-29: 5 ug/min via INTRAVENOUS

## 2019-10-29 MED ORDER — ETOMIDATE 2 MG/ML IV SOLN
INTRAVENOUS | Status: AC | PRN
  Administered 2019-10-29 (×2): 20 mg via INTRAVENOUS

## 2019-10-29 MED ORDER — ONDANSETRON HCL 4 MG/2ML IJ SOLN: 4.0000 mg | Freq: Four times a day (QID) | INTRAMUSCULAR | Status: DC | PRN

## 2019-10-29 MED ORDER — HALOPERIDOL LACTATE 5 MG/ML IJ SOLN: 0.5000 mg | INTRAMUSCULAR | Status: DC | PRN

## 2019-10-29 MED ORDER — SODIUM CHLORIDE 0.9 % IV SOLN: INTRAVENOUS | Status: DC

## 2019-10-29 MED ORDER — SODIUM BICARBONATE 8.4 % IV SOLN
100.0000 meq | Freq: Once | INTRAVENOUS | Status: AC
  Administered 2019-10-29: 100 meq via INTRAVENOUS

## 2019-10-29 MED ORDER — SODIUM CHLORIDE 0.9% FLUSH: 3.0000 mL | INTRAVENOUS | Status: DC | PRN

## 2019-10-29 MED ORDER — SODIUM CHLORIDE 0.9% FLUSH: 3.0000 mL | Freq: Two times a day (BID) | INTRAVENOUS | Status: DC

## 2019-10-29 MED ORDER — HALOPERIDOL 0.5 MG PO TABS
0.5000 mg | ORAL_TABLET | ORAL | Status: DC | PRN
  Filled 2019-10-29: qty 1

## 2019-10-29 MED ORDER — SODIUM CHLORIDE 0.9 % IV SOLN
0.0000 ug/h | INTRAVENOUS | Status: DC
  Filled 2019-10-29: qty 50

## 2019-10-29 MED ORDER — NOREPINEPHRINE BITARTRATE 1 MG/ML IV SOLN
INTRAVENOUS | Status: AC
  Filled 2019-10-29: qty 4

## 2019-10-29 MED ORDER — FENTANYL CITRATE (PF) 250 MCG/5ML IJ SOLN
INTRAMUSCULAR | Status: AC
  Filled 2019-10-29: qty 5

## 2019-10-29 MED ORDER — ACETAMINOPHEN 650 MG RE SUPP: 650.0000 mg | Freq: Four times a day (QID) | RECTAL | Status: DC | PRN

## 2019-10-29 MED ORDER — POTASSIUM CHLORIDE 10 MEQ/100ML IV SOLN: 10.0000 meq | INTRAVENOUS | Status: DC

## 2019-10-29 MED ORDER — CALCIUM GLUCONATE-NACL 1-0.675 GM/50ML-% IV SOLN
1.0000 g | Freq: Once | INTRAVENOUS | Status: AC
  Administered 2019-10-29: 1000 mg via INTRAVENOUS
  Filled 2019-10-29: qty 50

## 2019-10-29 MED ORDER — GLYCOPYRROLATE 1 MG PO TABS: 1.0000 mg | ORAL_TABLET | ORAL | Status: DC | PRN

## 2019-10-29 MED ORDER — BIOTENE DRY MOUTH MT LIQD: 15.0000 mL | OROMUCOSAL | Status: DC | PRN

## 2019-10-29 MED ORDER — DEXTROSE-NACL 5-0.45 % IV SOLN: INTRAVENOUS | Status: DC

## 2019-10-29 MED ORDER — POLYVINYL ALCOHOL 1.4 % OP SOLN: 1.0000 [drp] | Freq: Four times a day (QID) | OPHTHALMIC | Status: DC | PRN

## 2019-10-29 MED ORDER — SODIUM CHLORIDE 0.9 % IV SOLN: 250.0000 mL | INTRAVENOUS | Status: DC | PRN

## 2019-10-29 MED ORDER — INSULIN REGULAR(HUMAN) IN NACL 100-0.9 UT/100ML-% IV SOLN: INTRAVENOUS | Status: DC

## 2019-10-30 LAB — HEMOGLOBIN A1C
Hgb A1c MFr Bld: >15.5 % — ABNORMAL HIGH (ref 4.8–5.6)
Mean Plasma Glucose: >398 mg/dL

## 2019-10-31 DIAGNOSIS — 419620001 Death: Secondary | SNOMED CT | POA: Insufficient documentation

## 2019-10-31 MED FILL — Medication: Qty: 1 | Status: AC

## 2019-10-31 NOTE — Progress Notes (Signed)
Changed to comfort care

## 2019-10-31 NOTE — ED Notes (Signed)
Mother and step-father are present and talking about the patient's wishes at this time. Mother states "I am leaning toward not doing the surgery because I don't think my son would have wanted it." Anesthiologist, AC and Surgeon talking with family at this time to come up with a final decision whether to do surgery or make him comfort care only. Family offered chaplain services and refused.

## 2019-10-31 NOTE — Progress Notes (Signed)
[facility]  After a long discussion and follow up with the mother, Joseph Gallegos, after she was able to be with Joseph Gallegos, she has decided to make him comfort care given his critical nature, high risk for mortality and morbidity, and expressed wishes to her this AM that he did not want surgery when he was lucid.  Dr. Roderic Palau, Hospitalist, aware and will help with Dr. Langston Masker to make the patient comfort care. Mother and step father at bedside.   Curlene Labrum, MD [facility] [address] [place], [place] [address] [phone] (office)
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
## 2019-10-31 NOTE — Progress Notes (Signed)
Notified bedside nurse of need to administer fluid bolus.  

## 2019-10-31 NOTE — Progress Notes (Signed)
Notified provider of need to administer fluid bolus, pt needs 5160 cc fluid.

## 2019-10-31 NOTE — Sedation Documentation (Signed)
Dr. Langston Masker intubating with size 7.5 ett, 26cm at lip.  Positive color change on co2 detector.  Breath sounds audible.

## 2019-10-31 NOTE — Progress Notes (Signed)
To OR at this time per ED RN

## 2019-10-31 NOTE — ED Notes (Signed)
Attempted to contact pt cotact Joseph Gallegos but no answer

## 2019-10-31 NOTE — Progress Notes (Signed)
Pt arrived EMS on CPAP. Pt unresponsive. Preparing for intubation. Nasal trumpet inserted in left nare. Bagged pt until intubation then placed pt on ventilator

## 2019-10-31 NOTE — Anesthesia Preprocedure Evaluation (Deleted)
Anesthesia Evaluation  Patient identified by MRN, date of birth, ID band Patient awake    Reviewed: Allergy & Precautions, H&P , NPO status , Patient's Chart, lab work & pertinent test results, reviewed documented beta blocker date and time   Airway Mallampati: Intubated  TM Distance: >3 FB Neck ROM: full    Dental no notable dental hx.    Pulmonary neg pulmonary ROS, Current Smoker,    breath sounds clear to auscultation + decreased breath sounds+ wheezing  rales    Cardiovascular Exercise Tolerance: Good hypertension, negative cardio ROS   Rhythm:irregular Rate:Tachycardia     Neuro/Psych PSYCHIATRIC DISORDERS Schizophrenia negative neurological ROS     GI/Hepatic negative GI ROS, Neg liver ROS,   Endo/Other  negative endocrine ROSPoorly Controlled, Type 2  Renal/GU negative Renal ROS  negative genitourinary   Musculoskeletal   Abdominal   Peds  Hematology negative hematology ROS (+)   Anesthesia Other Findings Unable to assess teeth/airway.  Reproductive/Obstetrics negative OB ROS                             Anesthesia Physical Anesthesia Plan  ASA: IV and emergent  Anesthesia Plan: General   Post-op Pain Management:    Induction:   PONV Risk Score and Plan: 2  Airway Management Planned: Oral ETT  Additional Equipment: Arterial line  Intra-op Plan:   Post-operative Plan: Possible Post-op intubation/ventilation  Informed Consent: I have reviewed the patients History and Physical, chart, labs and discussed the procedure including the risks, benefits and alternatives for the proposed anesthesia with the patient or authorized representative who has indicated his/her understanding and acceptance.     History available from chart only and Only emergency history available  Plan Discussed with: CRNA  Anesthesia Plan Comments:         Anesthesia Quick Evaluation
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
## 2019-10-31 NOTE — ED Notes (Signed)
Fentanyl citrate 2500 mcg in sodium chloride 0.9%2109mL infusion wasted in the stericycle canister at this time with witness Jason Nest., RN.

## 2019-10-31 NOTE — ED Notes (Signed)
Patient stating that he wanted to leave if he could not get ice chips and that he did not want surgery. Patient pulled out both iv, catheters are intact. Hospitalist in to talk with patient patient states that he was not having surgery and he was leaving against medical advice. Explained to patient that he could die. Declined to stay. Patient ripped out foley catheter balloon intact. Lama aware charged nurse aware.

## 2019-10-31 NOTE — ED Notes (Signed)
Time of death called at 45. Mother and Stepfather present at bedside.

## 2019-10-31 NOTE — ED Notes (Signed)
CRITICAL VALUE ALERT  Critical Value:  Glucose 849  Date & Time Notied:  Nov 28, 2019 1220 Provider Notified: Dr Langston Masker  Orders Received/Actions taken: No orders received at this time

## 2019-10-31 NOTE — ED Notes (Signed)
Explained multiple times to patient could die if he left. Acknowledged. Attempted to call family to talk with patient. No answer patient dressed and left department.

## 2019-10-31 NOTE — Discharge Summary (Addendum)
Leaving AMA  Patient was earlier admitted with sigmoid colon mass with cecal perforation and was scheduled to undergo surgery at 6 AM.  However patient demanded water and was told that he cannot have water and has surgery scheduled at 6 AM.  Patient wants to leave AMA.  Myself and ED physician Dr. Melanee Left spoke to patient, he is alert oriented x3, he knows that he has colonic perforation and he can die if he leaves AMA.  Patient was shown images of CT scan on epic.  At this time patient is refusing surgery and wants to leave AMA.  He denies having thoughts of hurting himself.  RN tried to to call patient's mother at home, however unable to contact her.

## 2019-10-31 NOTE — ED Triage Notes (Signed)
Pt here yesterday and left AMA.  EMS says an aid called ems pt was "tripod" at kitchen table, diaphoretic, unable to speak.  EMS arrived and o2 sat was 73% on room air.  PT unable to stand.  EMS placed pt on NRB, reports pt kept pulling the mask off.  EMS put pt on cpap, started IV in R ac.  12 lead showed a lot of artifact but thinks was ST.  Pt went unrespnsive upon arrival to ED.  Weak radial pulses, strong carotid pulse.  Respiratory bagging pt.

## 2019-10-31 NOTE — ED Provider Notes (Addendum)
Patient holding for admission. Patient adamant about leaving [place].  Dr. Darrick Meigs and nursing staff at bedside.  Patient states he wants to eat and drink and does not want surgery.  He is alert and oriented x3.  Explained to the patient that he could potentially die from ruptured intestines that can cause septic shock and death. Not listening and pulling out his IV and Foley catheter.  Dr. Darrick Meigs and nursing staff talking the patient.  Patient alert and oriented x3.  Denies any thoughts of suicide.  He is adamant that he is leaving.  He appears to have capacity to make this decision.  Family was attempted to be contacted which was not successful. Dr. Darrick Meigs trying to reason with patient further.     Joseph Essex, MD 11-06-2019 Germaine Pomfret    Joseph Essex, MD 11/06/19 [phone]

## 2019-10-31 NOTE — Progress Notes (Signed)
Pt extubated to room air. Family at bedside and Pt's RN

## 2019-10-31 NOTE — Sedation Documentation (Signed)
Dr. Langston Masker preparing for central line
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
## 2019-10-31 NOTE — ED Notes (Addendum)
Attempted to call mother to notify and obtain consent. No answer. Elita Quick [phone].  Also attempted to call patient's cell phone, which is not in his possession. No answer.

## 2019-10-31 NOTE — ED Notes (Signed)
Patient attempting to drink out of sink x 2. Sink secured.

## 2019-10-31 NOTE — ED Notes (Signed)
Beeped Dr Constance Haw per Dr. Langston Masker.

## 2019-10-31 NOTE — Progress Notes (Signed)
Notified bedside nurse of need to administer fluid bolus 5160 cc needed.

## 2019-10-31 NOTE — ED Notes (Signed)
CRITICAL VALUE ALERT  Critical Value:  Ph 7.08  Date & Time Notied:  11-06-2019, 1217  Provider Notified: Dr. Constance Haw  Orders Received/Actions taken: Put ice packs on patient
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
## 2019-10-31 NOTE — Procedures (Deleted)
Intubation Procedure Note  Joseph Gallegos  [id number]  01-15-1971  Date:November 05, 2019  Time:12:20 PM   Provider Performing:Novella Abraha Hal Neer    Procedure: Intubation ([address])  Indication(s) Respiratory Failure  Consent Unable to obtain consent due to emergent nature of procedure.   Anesthesia Etomidate   Time Out Verified patient identification, verified procedure, site/side was marked, verified correct patient position, special equipment/implants available, medications/allergies/relevant history reviewed, required imaging and test results available.   Sterile Technique Usual hand hygeine, masks, and gloves were used   Procedure Description Patient positioned in bed supine.  Sedation given as noted above.  Patient was intubated with endotracheal tube using Glidescope.  View was Grade 1 full glottis .  Number of attempts was 1.  Colorimetric CO2 detector was consistent with tracheal placement.   Complications/Tolerance None; patient tolerated the procedure well. Chest X-ray is ordered to verify placement.   EBL Minimal   Specimen(s) None

## 2019-10-31 NOTE — ED Notes (Addendum)
Family (mother/stepdad) decided at this time to not have surgery and to make him comfort care and expressed those wishes to Dr. Constance Haw at bedside with presence of house [facility] ([place]) and Wells Guiles, RN.

## 2019-10-31 DEATH — deceased

## 2019-11-03 LAB — CULTURE, BLOOD (ROUTINE X 2)
Culture: NO GROWTH
Special Requests: ADEQUATE
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
## 2021-01-14 IMAGING — CT CT ABD-PELV W/ CM
2 of 4 series · 15 of 46 positions shown, 17 images · IV contrast (omnipaque)
Comparison: None.

CLINICAL DATA: Abdominal pain for several days,
nausea/vomiting/diarrhea yesterday

EXAM:
CT ABDOMEN AND PELVIS WITH CONTRAST
TECHNIQUE: Multidetector CT imaging of the abdomen and pelvis was performed
using the standard protocol following bolus administration of
intravenous contrast.
CONTRAST:  100mL OMNIPAQUE IOHEXOL 300 MG/ML  SOLN

[Series 2: axial st · axial · 0.98mm/px · z∈[+979,+1499]mm · 12 of 116 slices shown, 14 images]
[im 6/116  soft-tissue]
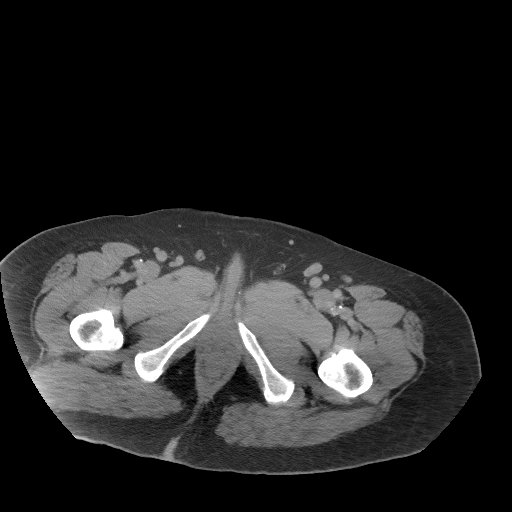
[im 6/116  bone]
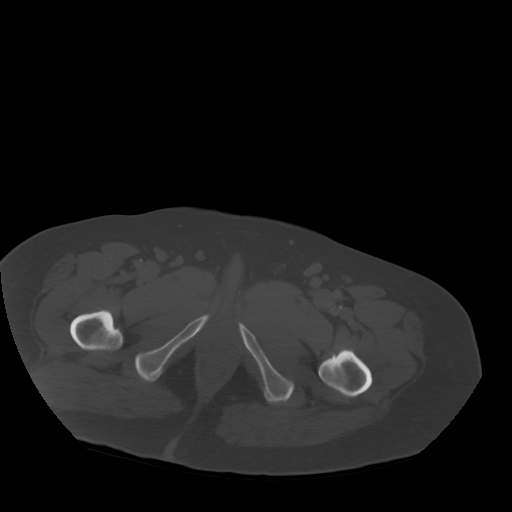
[im 17/116  soft-tissue]
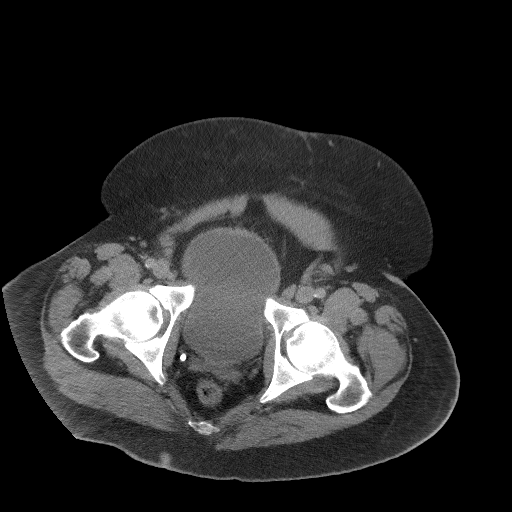
[im 28/116  soft-tissue]
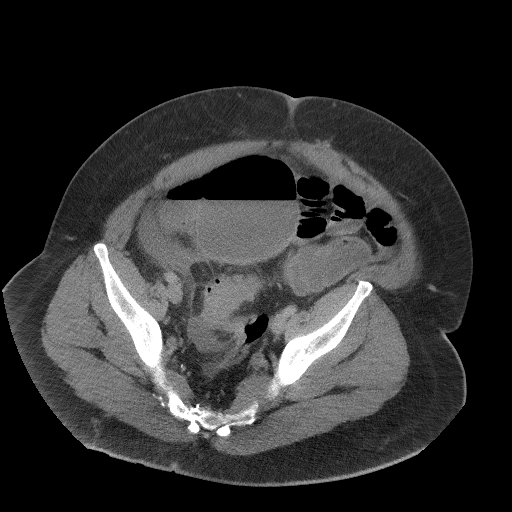
[im 33/116  soft-tissue]
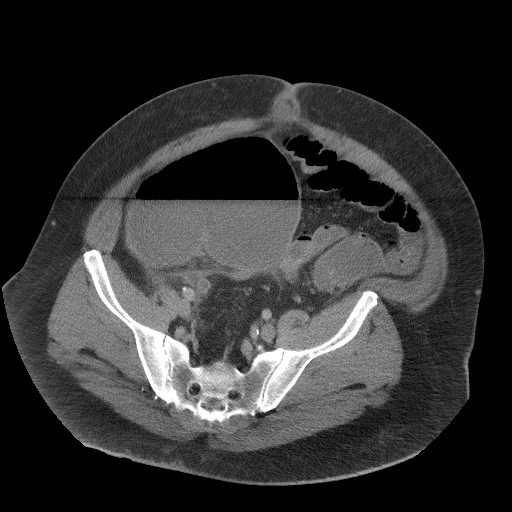
[im 44/116  soft-tissue]
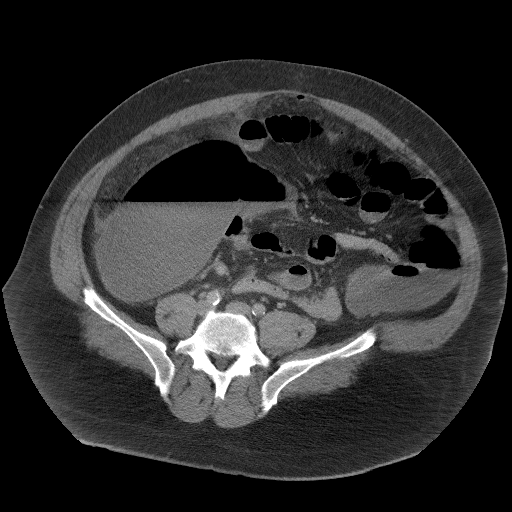
[im 55/116  soft-tissue]
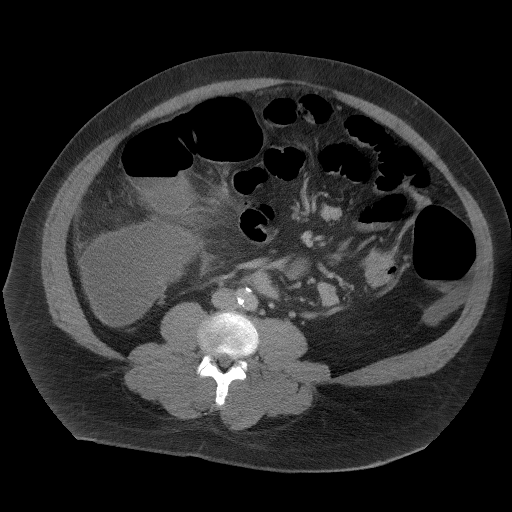
[im 61/116  soft-tissue]
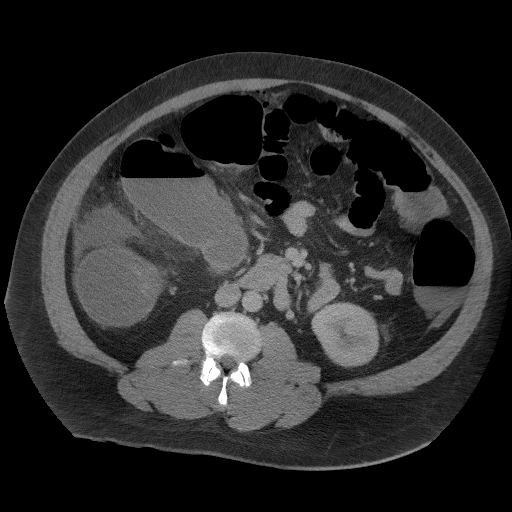
[im 72/116  soft-tissue]
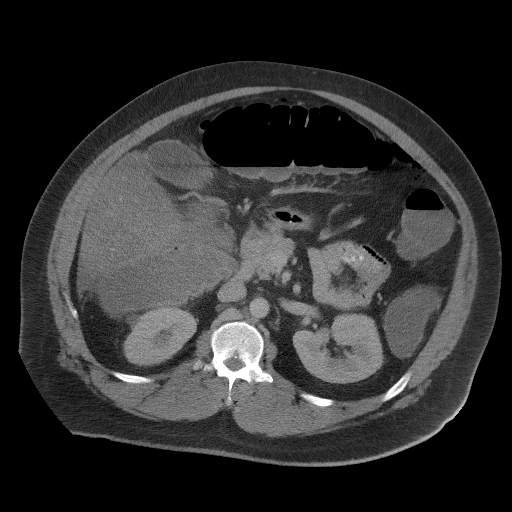
[im 83/116  soft-tissue]
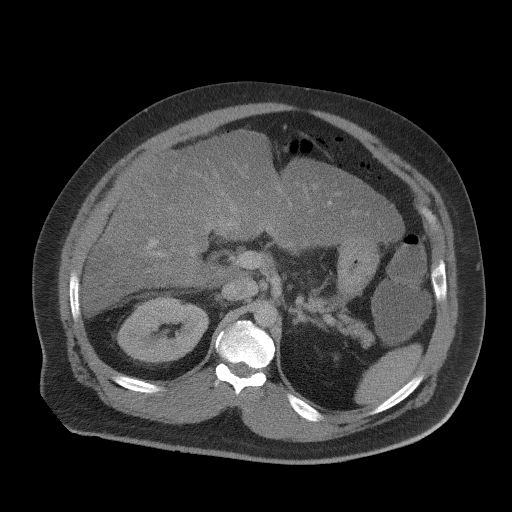
[im 83/116  bone]
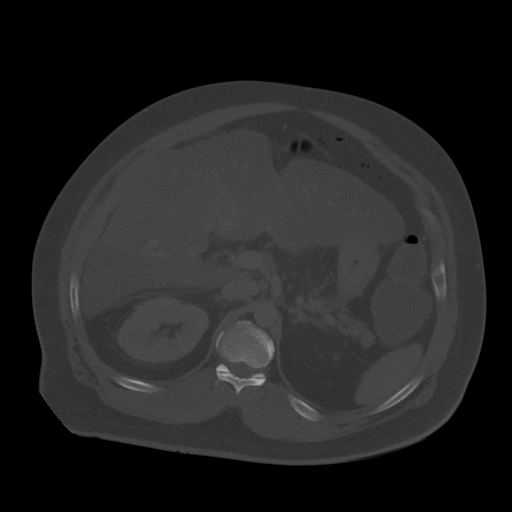
[im 88/116  soft-tissue]
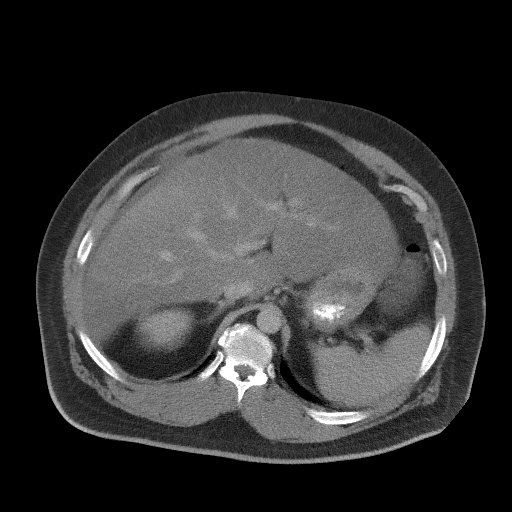
[im 99/116  soft-tissue]
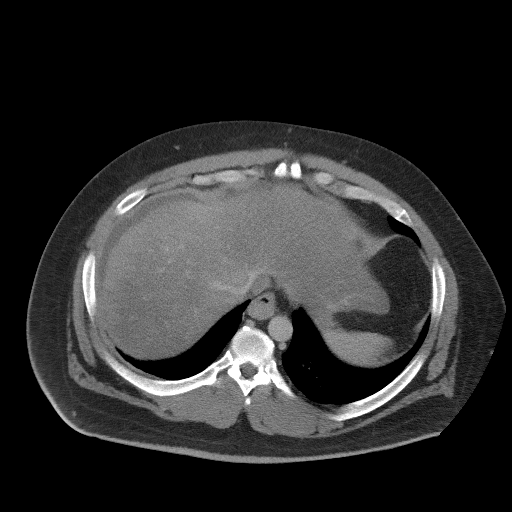
[im 110/116  soft-tissue]
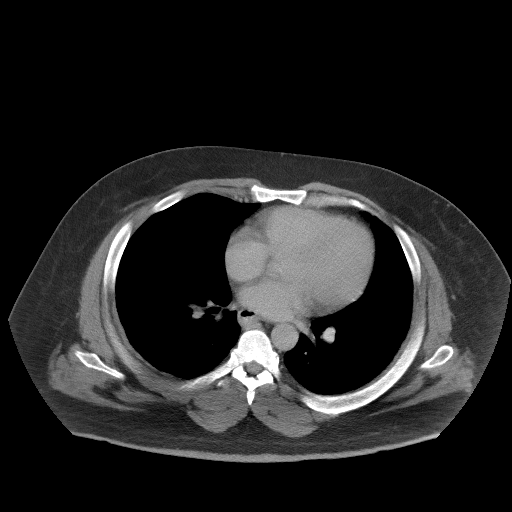

[Series 5: coronal st · coronal · 1.01mm/px · 3 of 134 slices shown]
[im 45/134  soft-tissue]
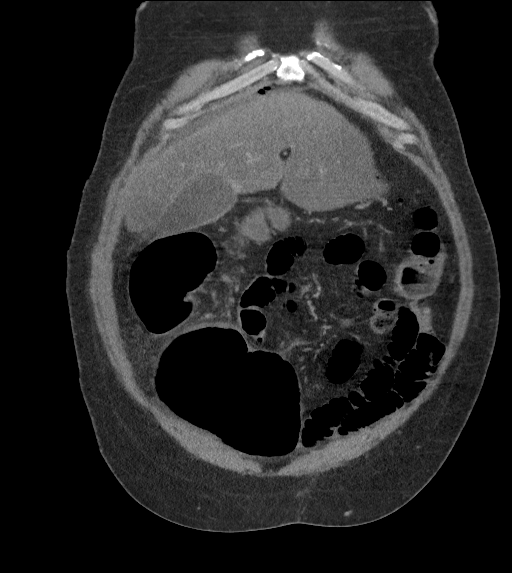
[im 60/134  soft-tissue]
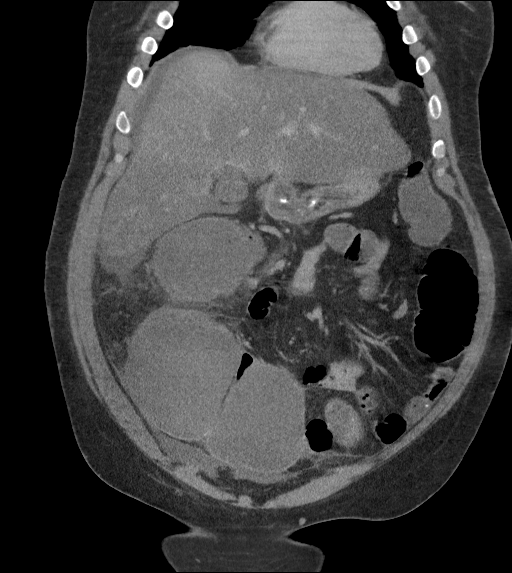
[im 74/134  soft-tissue]
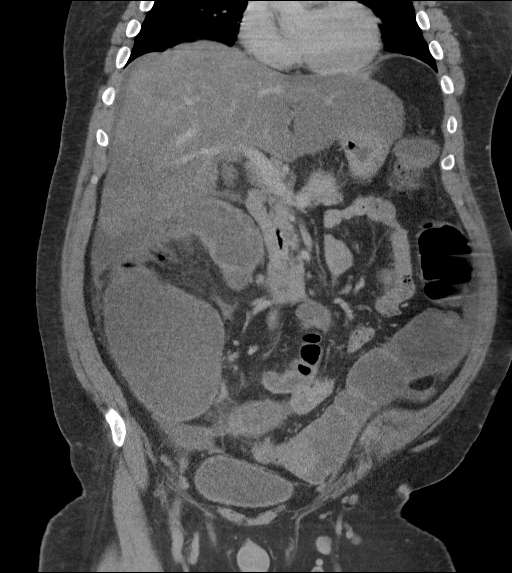

[15 of 46 positions shown; findings below may reference images not displayed]

FINDINGS: Lower chest: No acute pleural or parenchymal lung disease.

Hepatobiliary: Heterogeneous decreased attenuation of the liver
compatible with steatosis. Areas of likely fatty sparing are seen
along the dome of the liver. No intrahepatic duct dilation. Layering
sludge within the gallbladder without calcified gallstones or
cholecystitis.

Pancreas: Unremarkable. No pancreatic ductal dilatation or
surrounding inflammatory changes.

Spleen: Normal in size without focal abnormality.

Adrenals/Urinary Tract: There are bilateral indeterminate renal
hypodensities, measuring up to 1.7 cm on the right and 1.1 cm on the
left. These likely reflect cysts. No urinary tract calculi or
obstructive uropathy. Adrenals are unremarkable. The bladder is
normal.

Stomach/Bowel: The colon is markedly distended with diffuse gas
fluid levels. The cecum measures up to 10.9 cm in diameter. There is
concentric wall thickening of the mid sigmoid colon, extending
approximately 8.3 cm in length. The appearance is highly suggestive
of colonic neoplasm, though high-grade stricture/scarring could be
considered given the adjacent diverticulosis.

Pneumoperitoneum is identified, as well as prominent free fluid in
the right lower quadrant adjacent to the dilated cecum. Findings are
concerning for colonic obstruction and subsequent cecal perforation.
Surgical consultation is recommended.

Vascular/Lymphatic: Mild atherosclerosis of the aorta and its distal
branches. There are no pathologically enlarged lymph nodes.

Reproductive: Prostate is unremarkable.

Other: Free fluid is seen within the lower abdomen and pelvis, most
pronounced in the right lower quadrant. Punctate foci of
pneumoperitoneum are seen throughout the upper abdomen. No abdominal
wall hernia.

Musculoskeletal: No acute or destructive bony lesions. Reconstructed
images demonstrate no additional findings.
IMPRESSION: 1. Concentric masslike wall thickening of the sigmoid colon highly
concerning for neoplasm.
2. Marked colonic distension, most pronounced in the cecum, with
evidence of bowel perforation. Given location of free gas and free
fluid, cecal perforation is suspected. Surgical consultation is
recommended.
3. Heterogeneous decreased attenuation of the liver consistent with
hepatic steatosis.
4. Nonspecific renal hypodensities, favor cysts. Nonemergent
ultrasound could be performed for follow-up if desired.

These results were called by telephone at the time of interpretation
acknowledged these results.
